# Patient Record
Sex: Female | Born: 1955 | Race: White | Hispanic: No | Marital: Married | Smoking: Never smoker
Health system: Southern US, Community
[De-identification: ages and names within clinical notes are randomized; demographics above are authoritative.]

## PROBLEM LIST (undated history)

## (undated) DIAGNOSIS — M1990 Unspecified osteoarthritis, unspecified site: Secondary | ICD-10-CM

## (undated) DIAGNOSIS — E785 Hyperlipidemia, unspecified: Secondary | ICD-10-CM

## (undated) DIAGNOSIS — R7303 Prediabetes: Secondary | ICD-10-CM

## (undated) HISTORY — DX: Unspecified osteoarthritis, unspecified site: M19.90

## (undated) HISTORY — PX: EYE SURGERY: SHX253

## (undated) HISTORY — DX: Hyperlipidemia, unspecified: E78.5

## (undated) HISTORY — DX: Prediabetes: R73.03

---

## 2005-11-21 LAB — HM COLONOSCOPY

## 2011-07-22 ENCOUNTER — Encounter: Payer: Self-pay | Admitting: Family Medicine

## 2011-07-22 ENCOUNTER — Ambulatory Visit (INDEPENDENT_AMBULATORY_CARE_PROVIDER_SITE_OTHER): Payer: BC Managed Care – PPO | Admitting: Family Medicine

## 2011-07-22 VITALS — BP 114/72 | HR 80 | Temp 98.8°F | Ht 63.5 in | Wt 148.0 lb

## 2011-07-22 DIAGNOSIS — F439 Reaction to severe stress, unspecified: Secondary | ICD-10-CM

## 2011-07-22 DIAGNOSIS — F430 Acute stress reaction: Secondary | ICD-10-CM

## 2011-07-22 NOTE — Progress Notes (Signed)
  Subjective:    Patient ID: Olivia [name], female    DOB: 09/27/1955, 56 y.o.   MRN: [id number]  HPI Pt here to establish.     Review of Systems As above    Objective:   Physical Exam  Constitutional: She is oriented to person, place, and time. She appears well-developed and well-nourished.  Cardiovascular: Normal rate and regular rhythm.   No murmur heard. Pulmonary/Chest: Effort normal and breath sounds normal. No respiratory distress. She has no wheezes. She has no rales.  Neurological: She is alert and oriented to person, place, and time.  Psychiatric: She has a normal mood and affect. Her behavior is normal. Judgment and thought content normal.          Assessment & Plan:  Stress reaction---  Improving every day                                No further treatment necessary rto for cpe

## 2011-07-22 NOTE — Patient Instructions (Addendum)
Preventive Care for Adults, Female A healthy lifestyle and preventive care can promote health and wellness. Preventive health guidelines for women include the following key practices.  A routine yearly physical is a good way to check with your caregiver about your health and preventive screening. It is a chance to share any concerns and updates on your health, and to receive a thorough exam.   Visit your dentist for a routine exam and preventive care every 6 months. Brush your teeth twice a day and floss once a day. Good oral hygiene prevents tooth decay and gum disease.   The frequency of eye exams is based on your age, health, family medical history, use of contact lenses, and other factors. Follow your caregiver's recommendations for frequency of eye exams.   Eat a healthy diet. Foods like vegetables, fruits, whole grains, low-fat dairy products, and lean protein foods contain the nutrients you need without too many calories. Decrease your intake of foods high in solid fats, added sugars, and salt. Eat the right amount of calories for you.Get information about a proper diet from your caregiver, if necessary.   Regular physical exercise is one of the most important things you can do for your health. Most adults should get at least 150 minutes of moderate-intensity exercise (any activity that increases your heart rate and causes you to sweat) each week. In addition, most adults need muscle-strengthening exercises on 2 or more days a week.   Maintain a healthy weight. The body mass index (BMI) is a screening tool to identify possible weight problems. It provides an estimate of body fat based on height and weight. Your caregiver can help determine your BMI, and can help you achieve or maintain a healthy weight.For adults 20 years and older:   A BMI below 18.5 is considered underweight.   A BMI of 18.5 to 24.9 is normal.   A BMI of 25 to 29.9 is considered overweight.   A BMI of 30 and above is  considered obese.   Maintain normal blood lipids and cholesterol levels by exercising and minimizing your intake of saturated fat. Eat a balanced diet with plenty of fruit and vegetables. Blood tests for lipids and cholesterol should begin at age 20 and be repeated every 5 years. If your lipid or cholesterol levels are high, you are over 50, or you are at high risk for heart disease, you may need your cholesterol levels checked more frequently.Ongoing high lipid and cholesterol levels should be treated with medicines if diet and exercise are not effective.   If you smoke, find out from your caregiver how to quit. If you do not use tobacco, do not start.   If you are pregnant, do not drink alcohol. If you are breastfeeding, be very cautious about drinking alcohol. If you are not pregnant and choose to drink alcohol, do not exceed 1 drink per day. One drink is considered to be 12 ounces (355 mL) of beer, 5 ounces (148 mL) of wine, or 1.5 ounces (44 mL) of liquor.   Avoid use of street drugs. Do not share needles with anyone. Ask for help if you need support or instructions about stopping the use of drugs.   High blood pressure causes heart disease and increases the risk of stroke. Your blood pressure should be checked at least every 1 to 2 years. Ongoing high blood pressure should be treated with medicines if weight loss and exercise are not effective.   If you are 55 to 58   years old, ask your caregiver if you should take aspirin to prevent strokes.   Diabetes screening involves taking a blood sample to check your fasting blood sugar level. This should be done once every 3 years, after age 45, if you are within normal weight and without risk factors for diabetes. Testing should be considered at a younger age or be carried out more frequently if you are overweight and have at least 1 risk factor for diabetes.   Breast cancer screening is essential preventive care for women. You should practice "breast  self-awareness." This means understanding the normal appearance and feel of your breasts and may include breast self-examination. Any changes detected, no matter how small, should be reported to a caregiver. Women in their 20s and 30s should have a clinical breast exam (CBE) by a caregiver as part of a regular health exam every 1 to 3 years. After age 40, women should have a CBE every year. Starting at age 40, women should consider having a mammography (breast X-ray test) every year. Women who have a family history of breast cancer should talk to their caregiver about genetic screening. Women at a high risk of breast cancer should talk to their caregivers about having magnetic resonance imaging (MRI) and a mammography every year.   The Pap test is a screening test for cervical cancer. A Pap test can show cell changes on the cervix that might become cervical cancer if left untreated. A Pap test is a procedure in which cells are obtained and examined from the lower end of the uterus (cervix).   Women should have a Pap test starting at age 21.   Between ages 21 and 29, Pap tests should be repeated every 2 years.   Beginning at age 30, you should have a Pap test every 3 years as long as the past 3 Pap tests have been normal.   Some women have medical problems that increase the chance of getting cervical cancer. Talk to your caregiver about these problems. It is especially important to talk to your caregiver if a new problem develops soon after your last Pap test. In these cases, your caregiver may recommend more frequent screening and Pap tests.   The above recommendations are the same for women who have or have not gotten the vaccine for human papillomavirus (HPV).   If you had a hysterectomy for a problem that was not cancer or a condition that could lead to cancer, then you no longer need Pap tests. Even if you no longer need a Pap test, a regular exam is a good idea to make sure no other problems are  starting.   If you are between ages 65 and 70, and you have had normal Pap tests going back 10 years, you no longer need Pap tests. Even if you no longer need a Pap test, a regular exam is a good idea to make sure no other problems are starting.   If you have had past treatment for cervical cancer or a condition that could lead to cancer, you need Pap tests and screening for cancer for at least 20 years after your treatment.   If Pap tests have been discontinued, risk factors (such as a new sexual partner) need to be reassessed to determine if screening should be resumed.   The HPV test is an additional test that may be used for cervical cancer screening. The HPV test looks for the virus that can cause the cell changes on the cervix.   The cells collected during the Pap test can be tested for HPV. The HPV test could be used to screen women aged 30 years and older, and should be used in women of any age who have unclear Pap test results. After the age of 30, women should have HPV testing at the same frequency as a Pap test.   Colorectal cancer can be detected and often prevented. Most routine colorectal cancer screening begins at the age of 50 and continues through age 75. However, your caregiver may recommend screening at an earlier age if you have risk factors for colon cancer. On a yearly basis, your caregiver may provide home test kits to check for hidden blood in the stool. Use of a small camera at the end of a tube, to directly examine the colon (sigmoidoscopy or colonoscopy), can detect the earliest forms of colorectal cancer. Talk to your caregiver about this at age 50, when routine screening begins. Direct examination of the colon should be repeated every 5 to 10 years through age 75, unless early forms of pre-cancerous polyps or small growths are found.   Hepatitis C blood testing is recommended for all people born from 1945 through 1965 and any individual with known risks for hepatitis C.    Practice safe sex. Use condoms and avoid high-risk sexual practices to reduce the spread of sexually transmitted infections (STIs). STIs include gonorrhea, chlamydia, syphilis, trichomonas, herpes, HPV, and human immunodeficiency virus (HIV). Herpes, HIV, and HPV are viral illnesses that have no cure. They can result in disability, cancer, and death. Sexually active women aged 25 and younger should be checked for chlamydia. Older women with new or multiple partners should also be tested for chlamydia. Testing for other STIs is recommended if you are sexually active and at increased risk.   Osteoporosis is a disease in which the bones lose minerals and strength with aging. This can result in serious bone fractures. The risk of osteoporosis can be identified using a bone density scan. Women ages 65 and over and women at risk for fractures or osteoporosis should discuss screening with their caregivers. Ask your caregiver whether you should take a calcium supplement or vitamin D to reduce the rate of osteoporosis.   Menopause can be associated with physical symptoms and risks. Hormone replacement therapy is available to decrease symptoms and risks. You should talk to your caregiver about whether hormone replacement therapy is right for you.   Use sunscreen with sun protection factor (SPF) of 30 or more. Apply sunscreen liberally and repeatedly throughout the day. You should seek shade when your shadow is shorter than you. Protect yourself by wearing long sleeves, pants, a wide-brimmed hat, and sunglasses year round, whenever you are outdoors.   Once a month, do a whole body skin exam, using a mirror to look at the skin on your back. Notify your caregiver of new moles, moles that have irregular borders, moles that are larger than a pencil eraser, or moles that have changed in shape or color.   Stay current with required immunizations.   Influenza. You need a dose every fall (or winter). The composition of  the flu vaccine changes each year, so being vaccinated once is not enough.   Pneumococcal polysaccharide. You need 1 to 2 doses if you smoke cigarettes or if you have certain chronic medical conditions. You need 1 dose at age 65 (or older) if you have never been vaccinated.   Tetanus, diphtheria, pertussis (Tdap, Td). Get 1 dose of   Tdap vaccine if you are younger than age 65, are over 65 and have contact with an infant, are a healthcare worker, are pregnant, or simply want to be protected from whooping cough. After that, you need a Td booster dose every 10 years. Consult your caregiver if you have not had at least 3 tetanus and diphtheria-containing shots sometime in your life or have a deep or dirty wound.   HPV. You need this vaccine if you are a woman age 26 or younger. The vaccine is given in 3 doses over 6 months.   Measles, mumps, rubella (MMR). You need at least 1 dose of MMR if you were born in 1957 or later. You may also need a second dose.   Meningococcal. If you are age 19 to 21 and a first-year college student living in a residence hall, or have one of several medical conditions, you need to get vaccinated against meningococcal disease. You may also need additional booster doses.   Zoster (shingles). If you are age 60 or older, you should get this vaccine.   Varicella (chickenpox). If you have never had chickenpox or you were vaccinated but received only 1 dose, talk to your caregiver to find out if you need this vaccine.   Hepatitis A. You need this vaccine if you have a specific risk factor for hepatitis A virus infection or you simply wish to be protected from this disease. The vaccine is usually given as 2 doses, 6 to 18 months apart.   Hepatitis B. You need this vaccine if you have a specific risk factor for hepatitis B virus infection or you simply wish to be protected from this disease. The vaccine is given in 3 doses, usually over 6 months.  Preventive Services /  Frequency Ages 19 to 39  Blood pressure check.** / Every 1 to 2 years.   Lipid and cholesterol check.** / Every 5 years beginning at age 20.   Clinical breast exam.** / Every 3 years for women in their 20s and 30s.   Pap test.** / Every 2 years from ages 21 through 29. Every 3 years starting at age 30 through age 65 or 70 with a history of 3 consecutive normal Pap tests.   HPV screening.** / Every 3 years from ages 30 through ages 65 to 70 with a history of 3 consecutive normal Pap tests.   Hepatitis C blood test.** / For any individual with known risks for hepatitis C.   Skin self-exam. / Monthly.   Influenza immunization.** / Every year.   Pneumococcal polysaccharide immunization.** / 1 to 2 doses if you smoke cigarettes or if you have certain chronic medical conditions.   Tetanus, diphtheria, pertussis (Tdap, Td) immunization. / A one-time dose of Tdap vaccine. After that, you need a Td booster dose every 10 years.   HPV immunization. / 3 doses over 6 months, if you are 26 and younger.   Measles, mumps, rubella (MMR) immunization. / You need at least 1 dose of MMR if you were born in 1957 or later. You may also need a second dose.   Meningococcal immunization. / 1 dose if you are age 19 to 21 and a first-year college student living in a residence hall, or have one of several medical conditions, you need to get vaccinated against meningococcal disease. You may also need additional booster doses.   Varicella immunization.** / Consult your caregiver.   Hepatitis A immunization.** / Consult your caregiver. 2 doses, 6 to 18 months   apart.   Hepatitis B immunization.** / Consult your caregiver. 3 doses usually over 6 months.  Ages 40 to 64  Blood pressure check.** / Every 1 to 2 years.   Lipid and cholesterol check.** / Every 5 years beginning at age 20.   Clinical breast exam.** / Every year after age 40.   Mammogram.** / Every year beginning at age 40 and continuing for as  long as you are in good health. Consult with your caregiver.   Pap test.** / Every 3 years starting at age 30 through age 65 or 70 with a history of 3 consecutive normal Pap tests.   HPV screening.** / Every 3 years from ages 30 through ages 65 to 70 with a history of 3 consecutive normal Pap tests.   Fecal occult blood test (FOBT) of stool. / Every year beginning at age 50 and continuing until age 75. You may not need to do this test if you get a colonoscopy every 10 years.   Flexible sigmoidoscopy or colonoscopy.** / Every 5 years for a flexible sigmoidoscopy or every 10 years for a colonoscopy beginning at age 50 and continuing until age 75.   Hepatitis C blood test.** / For all people born from 1945 through 1965 and any individual with known risks for hepatitis C.   Skin self-exam. / Monthly.   Influenza immunization.** / Every year.   Pneumococcal polysaccharide immunization.** / 1 to 2 doses if you smoke cigarettes or if you have certain chronic medical conditions.   Tetanus, diphtheria, pertussis (Tdap, Td) immunization.** / A one-time dose of Tdap vaccine. After that, you need a Td booster dose every 10 years.   Measles, mumps, rubella (MMR) immunization. / You need at least 1 dose of MMR if you were born in 1957 or later. You may also need a second dose.   Varicella immunization.** / Consult your caregiver.   Meningococcal immunization.** / Consult your caregiver.   Hepatitis A immunization.** / Consult your caregiver. 2 doses, 6 to 18 months apart.   Hepatitis B immunization.** / Consult your caregiver. 3 doses, usually over 6 months.  Ages 65 and over  Blood pressure check.** / Every 1 to 2 years.   Lipid and cholesterol check.** / Every 5 years beginning at age 20.   Clinical breast exam.** / Every year after age 40.   Mammogram.** / Every year beginning at age 40 and continuing for as long as you are in good health. Consult with your caregiver.   Pap test.** /  Every 3 years starting at age 30 through age 65 or 70 with a 3 consecutive normal Pap tests. Testing can be stopped between 65 and 70 with 3 consecutive normal Pap tests and no abnormal Pap or HPV tests in the past 10 years.   HPV screening.** / Every 3 years from ages 30 through ages 65 or 70 with a history of 3 consecutive normal Pap tests. Testing can be stopped between 65 and 70 with 3 consecutive normal Pap tests and no abnormal Pap or HPV tests in the past 10 years.   Fecal occult blood test (FOBT) of stool. / Every year beginning at age 50 and continuing until age 75. You may not need to do this test if you get a colonoscopy every 10 years.   Flexible sigmoidoscopy or colonoscopy.** / Every 5 years for a flexible sigmoidoscopy or every 10 years for a colonoscopy beginning at age 50 and continuing until age 75.   Hepatitis   C blood test.** / For all people born from 1945 through 1965 and any individual with known risks for hepatitis C.   Osteoporosis screening.** / A one-time screening for women ages 65 and over and women at risk for fractures or osteoporosis.   Skin self-exam. / Monthly.   Influenza immunization.** / Every year.   Pneumococcal polysaccharide immunization.** / 1 dose at age 65 (or older) if you have never been vaccinated.   Tetanus, diphtheria, pertussis (Tdap, Td) immunization. / A one-time dose of Tdap vaccine if you are over 65 and have contact with an infant, are a healthcare worker, or simply want to be protected from whooping cough. After that, you need a Td booster dose every 10 years.   Varicella immunization.** / Consult your caregiver.   Meningococcal immunization.** / Consult your caregiver.   Hepatitis A immunization.** / Consult your caregiver. 2 doses, 6 to 18 months apart.   Hepatitis B immunization.** / Check with your caregiver. 3 doses, usually over 6 months.  ** Family history and personal history of risk and conditions may change your caregiver's  recommendations. Document Released: 03/22/2001 Document Revised: 01/13/2011 Document Reviewed: 06/21/2010 [employer] Patient Information 2012 [employer]. 

## 2012-11-20 ENCOUNTER — Telehealth: Payer: Self-pay

## 2012-11-20 NOTE — Telephone Encounter (Signed)
Medication and allergies: done   [facility] [address] [place] for local pharmacy   Immunizations due: UTD flu vaccine at work  A/P:  Last:  Unsure of dates for [facility]. Has fallen behind and needs to get caught up  To Discuss with Provider: Would like PAP today Joint pain ? HM issues

## 2012-11-21 ENCOUNTER — Ambulatory Visit (INDEPENDENT_AMBULATORY_CARE_PROVIDER_SITE_OTHER): Payer: 59 | Admitting: Family Medicine

## 2012-11-21 ENCOUNTER — Encounter: Payer: Self-pay | Admitting: Family Medicine

## 2012-11-21 ENCOUNTER — Other Ambulatory Visit (HOSPITAL_COMMUNITY)
Admission: RE | Admit: 2012-11-21 | Discharge: 2012-11-21 | Disposition: A | Discharge status: Home / Self Care | Payer: 59 | Source: Ambulatory Visit | Attending: Family Medicine | Admitting: Family Medicine

## 2012-11-21 VITALS — BP 116/70 | HR 81 | Temp 98.3°F | Ht 63.5 in | Wt 150.0 lb

## 2012-11-21 DIAGNOSIS — Z01419 Encounter for gynecological examination (general) (routine) without abnormal findings: Secondary | ICD-10-CM | POA: Insufficient documentation

## 2012-11-21 DIAGNOSIS — Z124 Encounter for screening for malignant neoplasm of cervix: Secondary | ICD-10-CM

## 2012-11-21 DIAGNOSIS — Z1231 Encounter for screening mammogram for malignant neoplasm of breast: Secondary | ICD-10-CM

## 2012-11-21 DIAGNOSIS — Z0000 Encounter for general adult medical examination without abnormal findings: Secondary | ICD-10-CM

## 2012-11-21 DIAGNOSIS — E2839 Other primary ovarian failure: Secondary | ICD-10-CM

## 2012-11-21 DIAGNOSIS — N390 Urinary tract infection, site not specified: Secondary | ICD-10-CM

## 2012-11-21 DIAGNOSIS — Z1151 Encounter for screening for human papillomavirus (HPV): Secondary | ICD-10-CM | POA: Insufficient documentation

## 2012-11-21 LAB — POCT URINALYSIS DIPSTICK
Bilirubin, UA: NEGATIVE
Glucose, UA: NEGATIVE
Ketones, UA: NEGATIVE
Spec Grav, UA: <=1.005

## 2012-11-21 LAB — CBC WITH DIFFERENTIAL/PLATELET
Basophils Relative: 0.7 % (ref 0.0–3.0)
Eosinophils Absolute: 0.2 10*3/uL (ref 0.0–0.7)
Lymphs Abs: 2.4 10*3/uL (ref 0.7–4.0)
MCHC: 34.4 g/dL (ref 30.0–36.0)
MCV: 87.5 fl (ref 78.0–100.0)
Monocytes Absolute: 0.5 10*3/uL (ref 0.1–1.0)
Neutrophils Relative %: 54.9 % (ref 43.0–77.0)
RBC: 4.81 Mil/uL (ref 3.87–5.11)

## 2012-11-21 LAB — TSH: TSH: 1.62 u[IU]/mL (ref 0.35–5.50)

## 2012-11-21 LAB — LIPID PANEL
Cholesterol: 258 mg/dL — ABNORMAL HIGH (ref 0–200)
HDL: 69.1 mg/dL (ref >39.00)
Triglycerides: 116 mg/dL (ref 0.0–149.0)

## 2012-11-21 LAB — HEPATIC FUNCTION PANEL
Albumin: 4.6 g/dL (ref 3.5–5.2)
Bilirubin, Direct: 0 mg/dL (ref 0.0–0.3)
Total Protein: 8.1 g/dL (ref 6.0–8.3)

## 2012-11-21 LAB — BASIC METABOLIC PANEL
BUN: 11 mg/dL (ref 6–23)
CO2: 29 mEq/L (ref 19–32)
Chloride: 99 mEq/L (ref 96–112)
Creatinine, Ser: 0.7 mg/dL (ref 0.4–1.2)

## 2012-11-21 LAB — MICROALBUMIN / CREATININE URINE RATIO: Microalb, Ur: 0.1 mg/dL (ref 0.0–1.9)

## 2012-11-21 NOTE — Progress Notes (Signed)
Subjective:     Olivia [name] is a 57 y.o. female and is here for a comprehensive physical exam. The patient reports no problems.  History   Social History  . Marital Status: Married    Spouse Name: N/A    Number of Children: 2  . Years of Education: N/A   Occupational History  . 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 Smoking status: Never Smoker   . Smokeless tobacco: Never Used  . Alcohol Use: 1.0 oz/week    2 drink(s) per week  . Drug Use: No  . Sexual Activity: Yes    Partners: Male   Other Topics Concern  . Not on file   Social History Narrative   Exercise---  3-4 x a week   Health Maintenance  Topic Date Due  . Mammogram  01/26/1972  . Pap Smear  01/26/1972  . Influenza Vaccine  09/07/2013  . Colonoscopy  02/08/2015  . Tetanus/tdap  09/07/2020    The following portions of the patient's history were reviewed and updated as appropriate:  She  has no past medical history on file. She  does not have a problem list on file. She  has no past surgical history on file. Her family history includes Arthritis in her mother; Cancer in her father; Dementia in her mother; Diabetes in her father; Heart disease in her father; Hypertension in her father; Stroke in her father. She  reports that she has never smoked. She has never used smokeless tobacco. She reports that she drinks about 1.0 ounces of alcohol per week. She reports that she does not use illicit drugs. She has a current medication list which includes the following prescription(s): aspirin-acetaminophen-caffeine. Current Outpatient Prescriptions on File Prior to Visit  Medication Sig Dispense Refill  . aspirin-acetaminophen-caffeine (EXCEDRIN MIGRAINE) 250-250-65 MG per tablet Take 1 tablet by mouth every 6 (six) hours as needed.       No current facility-administered medications on file prior to visit.   She has No Known Allergies..  Review of Systems Review of Systems  Constitutional:  Negative for activity change, appetite change and fatigue.  HENT: Negative for hearing loss, congestion, tinnitus and ear discharge.  dentist--due Eyes: Negative for visual disturbance (see optho q1y -- vision corrected to 20/20 with glasses).  Respiratory: Negative for cough, chest tightness and shortness of breath.   Cardiovascular: Negative for chest pain, palpitations and leg swelling.  Gastrointestinal: Negative for abdominal pain, diarrhea, constipation and abdominal distention.  Genitourinary: Negative for urgency, frequency, decreased urine volume and difficulty urinating.  Musculoskeletal: Negative for back pain, arthralgias and gait problem.  Skin: Negative for color change, pallor and rash.  Neurological: Negative for dizziness, light-headedness, numbness and headaches.  Hematological: Negative for adenopathy. Does not bruise/bleed easily.  Psychiatric/Behavioral: Negative for suicidal ideas, confusion, sleep disturbance, self-injury, dysphoric mood, decreased concentration and agitation.       Objective:    BP 116/70  Pulse 81  Temp(Src) 98.3 F (36.8 C) (Oral)  Ht 5' 3.5" (1.613 m)  Wt 150 lb (68.04 kg)  BMI 26.15 kg/m2  SpO2 98% General appearance: alert, cooperative, appears stated age and no distress Head: Normocephalic, without obvious abnormality, atraumatic Eyes: conjunctivae/corneas clear. PERRL, EOM's intact. Fundi benign. Ears: normal TM's and external ear canals both ears Nose: Nares normal. Septum midline. Mucosa normal. No drainage or sinus tenderness. Throat: lips, mucosa, and tongue normal; teeth and gums normal Neck: no adenopathy, no carotid bruit, no JVD, supple, symmetrical, trachea  midline and thyroid not enlarged, symmetric, no tenderness/mass/nodules Back: symmetric, no curvature. ROM normal. No CVA tenderness. Lungs: clear to auscultation bilaterally Breasts: normal appearance, no masses or tenderness Heart: regular rate and rhythm, S1, S2  normal, no murmur, click, rub or gallop Abdomen: soft, non-tender; bowel sounds normal; no masses,  no organomegaly Pelvic: cervix normal in appearance, external genitalia normal, no adnexal masses or tenderness, no cervical motion tenderness, rectovaginal septum normal, uterus normal size, shape, and consistency and vagina normal without discharge --pap done Extremities: extremities normal, atraumatic, no cyanosis or edema Pulses: 2+ and symmetric Skin: Skin color, texture, turgor normal. No rashes or lesions Lymph nodes: Cervical, supraclavicular, and axillary nodes normal. Neurologic: Alert and oriented X 3, normal strength and tone. Normal symmetric reflexes. Normal coordination and gait Psych-no anxiety , no depression      Assessment:    Healthy female exam.     Plan:     ghm utd Check labs See After Visit Summary for Counseling Recommendations

## 2012-11-21 NOTE — Patient Instructions (Signed)
Preventive Care for Adults, Female A healthy lifestyle and preventive care can promote health and wellness. Preventive health guidelines for women include the following key practices.  A routine yearly physical is a good way to check with your caregiver about your health and preventive screening. It is a chance to share any concerns and updates on your health, and to receive a thorough exam.  Visit your dentist for a routine exam and preventive care every 6 months. Brush your teeth twice a day and floss once a day. Good oral hygiene prevents tooth decay and gum disease.  The frequency of eye exams is based on your age, health, family medical history, use of contact lenses, and other factors. Follow your caregiver's recommendations for frequency of eye exams.  Eat a healthy diet. Foods like vegetables, fruits, whole grains, low-fat dairy products, and lean protein foods contain the nutrients you need without too many calories. Decrease your intake of foods high in solid fats, added sugars, and salt. Eat the right amount of calories for you.Get information about a proper diet from your caregiver, if necessary.  Regular physical exercise is one of the most important things you can do for your health. Most adults should get at least 150 minutes of moderate-intensity exercise (any activity that increases your heart rate and causes you to sweat) each week. In addition, most adults need muscle-strengthening exercises on 2 or more days a week.  Maintain a healthy weight. The body mass index (BMI) is a screening tool to identify possible weight problems. It provides an estimate of body fat based on height and weight. Your caregiver can help determine your BMI, and can help you achieve or maintain a healthy weight.For adults 20 years and older:  A BMI below 18.5 is considered underweight.  A BMI of 18.5 to 24.9 is normal.  A BMI of 25 to 29.9 is considered overweight.  A BMI of 30 and above is  considered obese.  Maintain normal blood lipids and cholesterol levels by exercising and minimizing your intake of saturated fat. Eat a balanced diet with plenty of fruit and vegetables. Blood tests for lipids and cholesterol should begin at age 20 and be repeated every 5 years. If your lipid or cholesterol levels are high, you are over 50, or you are at high risk for heart disease, you may need your cholesterol levels checked more frequently.Ongoing high lipid and cholesterol levels should be treated with medicines if diet and exercise are not effective.  If you smoke, find out from your caregiver how to quit. If you do not use tobacco, do not start.  If you are pregnant, do not drink alcohol. If you are breastfeeding, be very cautious about drinking alcohol. If you are not pregnant and choose to drink alcohol, do not exceed 1 drink per day. One drink is considered to be 12 ounces (355 mL) of beer, 5 ounces (148 mL) of wine, or 1.5 ounces (44 mL) of liquor.  Avoid use of street drugs. Do not share needles with anyone. Ask for help if you need support or instructions about stopping the use of drugs.  High blood pressure causes heart disease and increases the risk of stroke. Your blood pressure should be checked at least every 1 to 2 years. Ongoing high blood pressure should be treated with medicines if weight loss and exercise are not effective.  If you are 55 to 59 years old, ask your caregiver if you should take aspirin to prevent strokes.  Diabetes   screening involves taking a blood sample to check your fasting blood sugar level. This should be done once every 3 years, after age 45, if you are within normal weight and without risk factors for diabetes. Testing should be considered at a younger age or be carried out more frequently if you are overweight and have at least 1 risk factor for diabetes.  Breast cancer screening is essential preventive care for women. You should practice "breast  self-awareness." This means understanding the normal appearance and feel of your breasts and may include breast self-examination. Any changes detected, no matter how small, should be reported to a caregiver. Women in their 20s and 30s should have a clinical breast exam (CBE) by a caregiver as part of a regular health exam every 1 to 3 years. After age 40, women should have a CBE every year. Starting at age 40, women should consider having a mammography (breast X-ray test) every year. Women who have a family history of breast cancer should talk to their caregiver about genetic screening. Women at a high risk of breast cancer should talk to their caregivers about having magnetic resonance imaging (MRI) and a mammography every year.  The Pap test is a screening test for cervical cancer. A Pap test can show cell changes on the cervix that might become cervical cancer if left untreated. A Pap test is a procedure in which cells are obtained and examined from the lower end of the uterus (cervix).  Women should have a Pap test starting at age 21.  Between ages 21 and 29, Pap tests should be repeated every 2 years.  Beginning at age 30, you should have a Pap test every 3 years as long as the past 3 Pap tests have been normal.  Some women have medical problems that increase the chance of getting cervical cancer. Talk to your caregiver about these problems. It is especially important to talk to your caregiver if a new problem develops soon after your last Pap test. In these cases, your caregiver may recommend more frequent screening and Pap tests.  The above recommendations are the same for women who have or have not gotten the vaccine for human papillomavirus (HPV).  If you had a hysterectomy for a problem that was not cancer or a condition that could lead to cancer, then you no longer need Pap tests. Even if you no longer need a Pap test, a regular exam is a good idea to make sure no other problems are  starting.  If you are between ages 65 and 70, and you have had normal Pap tests going back 10 years, you no longer need Pap tests. Even if you no longer need a Pap test, a regular exam is a good idea to make sure no other problems are starting.  If you have had past treatment for cervical cancer or a condition that could lead to cancer, you need Pap tests and screening for cancer for at least 20 years after your treatment.  If Pap tests have been discontinued, risk factors (such as a new sexual partner) need to be reassessed to determine if screening should be resumed.  The HPV test is an additional test that may be used for cervical cancer screening. The HPV test looks for the virus that can cause the cell changes on the cervix. The cells collected during the Pap test can be tested for HPV. The HPV test could be used to screen women aged 30 years and older, and should   be used in women of any age who have unclear Pap test results. After the age of 30, women should have HPV testing at the same frequency as a Pap test.  Colorectal cancer can be detected and often prevented. Most routine colorectal cancer screening begins at the age of 50 and continues through age 75. However, your caregiver may recommend screening at an earlier age if you have risk factors for colon cancer. On a yearly basis, your caregiver may provide home test kits to check for hidden blood in the stool. Use of a small camera at the end of a tube, to directly examine the colon (sigmoidoscopy or colonoscopy), can detect the earliest forms of colorectal cancer. Talk to your caregiver about this at age 50, when routine screening begins. Direct examination of the colon should be repeated every 5 to 10 years through age 75, unless early forms of pre-cancerous polyps or small growths are found.  Hepatitis C blood testing is recommended for all people born from 1945 through 1965 and any individual with known risks for hepatitis C.  Practice  safe sex. Use condoms and avoid high-risk sexual practices to reduce the spread of sexually transmitted infections (STIs). STIs include gonorrhea, chlamydia, syphilis, trichomonas, herpes, HPV, and human immunodeficiency virus (HIV). Herpes, HIV, and HPV are viral illnesses that have no cure. They can result in disability, cancer, and death. Sexually active women aged 25 and younger should be checked for chlamydia. Older women with new or multiple partners should also be tested for chlamydia. Testing for other STIs is recommended if you are sexually active and at increased risk.  Osteoporosis is a disease in which the bones lose minerals and strength with aging. This can result in serious bone fractures. The risk of osteoporosis can be identified using a bone density scan. Women ages 65 and over and women at risk for fractures or osteoporosis should discuss screening with their caregivers. Ask your caregiver whether you should take a calcium supplement or vitamin D to reduce the rate of osteoporosis.  Menopause can be associated with physical symptoms and risks. Hormone replacement therapy is available to decrease symptoms and risks. You should talk to your caregiver about whether hormone replacement therapy is right for you.  Use sunscreen with sun protection factor (SPF) of 30 or more. Apply sunscreen liberally and repeatedly throughout the day. You should seek shade when your shadow is shorter than you. Protect yourself by wearing long sleeves, pants, a wide-brimmed hat, and sunglasses year round, whenever you are outdoors.  Once a month, do a whole body skin exam, using a mirror to look at the skin on your back. Notify your caregiver of new moles, moles that have irregular borders, moles that are larger than a pencil eraser, or moles that have changed in shape or color.  Stay current with required immunizations.  Influenza. You need a dose every fall (or winter). The composition of the flu vaccine  changes each year, so being vaccinated once is not enough.  Pneumococcal polysaccharide. You need 1 to 2 doses if you smoke cigarettes or if you have certain chronic medical conditions. You need 1 dose at age 65 (or older) if you have never been vaccinated.  Tetanus, diphtheria, pertussis (Tdap, Td). Get 1 dose of Tdap vaccine if you are younger than age 65, are over 65 and have contact with an infant, are a healthcare worker, are pregnant, or simply want to be protected from whooping cough. After that, you need a Td   booster dose every 10 years. Consult your caregiver if you have not had at least 3 tetanus and diphtheria-containing shots sometime in your life or have a deep or dirty wound.  HPV. You need this vaccine if you are a woman age 26 or younger. The vaccine is given in 3 doses over 6 months.  Measles, mumps, rubella (MMR). You need at least 1 dose of MMR if you were born in 1957 or later. You may also need a second dose.  Meningococcal. If you are age 19 to 21 and a first-year college student living in a residence hall, or have one of several medical conditions, you need to get vaccinated against meningococcal disease. You may also need additional booster doses.  Zoster (shingles). If you are age 60 or older, you should get this vaccine.  Varicella (chickenpox). If you have never had chickenpox or you were vaccinated but received only 1 dose, talk to your caregiver to find out if you need this vaccine.  Hepatitis A. You need this vaccine if you have a specific risk factor for hepatitis A virus infection or you simply wish to be protected from this disease. The vaccine is usually given as 2 doses, 6 to 18 months apart.  Hepatitis B. You need this vaccine if you have a specific risk factor for hepatitis B virus infection or you simply wish to be protected from this disease. The vaccine is given in 3 doses, usually over 6 months. Preventive Services / Frequency Ages 19 to 39  Blood  pressure check.** / Every 1 to 2 years.  Lipid and cholesterol check.** / Every 5 years beginning at age 20.  Clinical breast exam.** / Every 3 years for women in their 20s and 30s.  Pap test.** / Every 2 years from ages 21 through 29. Every 3 years starting at age 30 through age 65 or 70 with a history of 3 consecutive normal Pap tests.  HPV screening.** / Every 3 years from ages 30 through ages 65 to 70 with a history of 3 consecutive normal Pap tests.  Hepatitis C blood test.** / For any individual with known risks for hepatitis C.  Skin self-exam. / Monthly.  Influenza immunization.** / Every year.  Pneumococcal polysaccharide immunization.** / 1 to 2 doses if you smoke cigarettes or if you have certain chronic medical conditions.  Tetanus, diphtheria, pertussis (Tdap, Td) immunization. / A one-time dose of Tdap vaccine. After that, you need a Td booster dose every 10 years.  HPV immunization. / 3 doses over 6 months, if you are 26 and younger.  Measles, mumps, rubella (MMR) immunization. / You need at least 1 dose of MMR if you were born in 1957 or later. You may also need a second dose.  Meningococcal immunization. / 1 dose if you are age 19 to 21 and a first-year college student living in a residence hall, or have one of several medical conditions, you need to get vaccinated against meningococcal disease. You may also need additional booster doses.  Varicella immunization.** / Consult your caregiver.  Hepatitis A immunization.** / Consult your caregiver. 2 doses, 6 to 18 months apart.  Hepatitis B immunization.** / Consult your caregiver. 3 doses usually over 6 months. Ages 40 to 64  Blood pressure check.** / Every 1 to 2 years.  Lipid and cholesterol check.** / Every 5 years beginning at age 20.  Clinical breast exam.** / Every year after age 40.  Mammogram.** / Every year beginning at age 40   and continuing for as long as you are in good health. Consult with your  caregiver.  Pap test.** / Every 3 years starting at age 30 through age 65 or 70 with a history of 3 consecutive normal Pap tests.  HPV screening.** / Every 3 years from ages 30 through ages 65 to 70 with a history of 3 consecutive normal Pap tests.  Fecal occult blood test (FOBT) of stool. / Every year beginning at age 50 and continuing until age 75. You may not need to do this test if you get a colonoscopy every 10 years.  Flexible sigmoidoscopy or colonoscopy.** / Every 5 years for a flexible sigmoidoscopy or every 10 years for a colonoscopy beginning at age 50 and continuing until age 75.  Hepatitis C blood test.** / For all people born from 1945 through 1965 and any individual with known risks for hepatitis C.  Skin self-exam. / Monthly.  Influenza immunization.** / Every year.  Pneumococcal polysaccharide immunization.** / 1 to 2 doses if you smoke cigarettes or if you have certain chronic medical conditions.  Tetanus, diphtheria, pertussis (Tdap, Td) immunization.** / A one-time dose of Tdap vaccine. After that, you need a Td booster dose every 10 years.  Measles, mumps, rubella (MMR) immunization. / You need at least 1 dose of MMR if you were born in 1957 or later. You may also need a second dose.  Varicella immunization.** / Consult your caregiver.  Meningococcal immunization.** / Consult your caregiver.  Hepatitis A immunization.** / Consult your caregiver. 2 doses, 6 to 18 months apart.  Hepatitis B immunization.** / Consult your caregiver. 3 doses, usually over 6 months. Ages 65 and over  Blood pressure check.** / Every 1 to 2 years.  Lipid and cholesterol check.** / Every 5 years beginning at age 20.  Clinical breast exam.** / Every year after age 40.  Mammogram.** / Every year beginning at age 40 and continuing for as long as you are in good health. Consult with your caregiver.  Pap test.** / Every 3 years starting at age 30 through age 65 or 70 with a 3  consecutive normal Pap tests. Testing can be stopped between 65 and 70 with 3 consecutive normal Pap tests and no abnormal Pap or HPV tests in the past 10 years.  HPV screening.** / Every 3 years from ages 30 through ages 65 or 70 with a history of 3 consecutive normal Pap tests. Testing can be stopped between 65 and 70 with 3 consecutive normal Pap tests and no abnormal Pap or HPV tests in the past 10 years.  Fecal occult blood test (FOBT) of stool. / Every year beginning at age 50 and continuing until age 75. You may not need to do this test if you get a colonoscopy every 10 years.  Flexible sigmoidoscopy or colonoscopy.** / Every 5 years for a flexible sigmoidoscopy or every 10 years for a colonoscopy beginning at age 50 and continuing until age 75.  Hepatitis C blood test.** / For all people born from 1945 through 1965 and any individual with known risks for hepatitis C.  Osteoporosis screening.** / A one-time screening for women ages 65 and over and women at risk for fractures or osteoporosis.  Skin self-exam. / Monthly.  Influenza immunization.** / Every year.  Pneumococcal polysaccharide immunization.** / 1 dose at age 65 (or older) if you have never been vaccinated.  Tetanus, diphtheria, pertussis (Tdap, Td) immunization. / A one-time dose of Tdap vaccine if you are over   65 and have contact with an infant, are a healthcare worker, or simply want to be protected from whooping cough. After that, you need a Td booster dose every 10 years.  Varicella immunization.** / Consult your caregiver.  Meningococcal immunization.** / Consult your caregiver.  Hepatitis A immunization.** / Consult your caregiver. 2 doses, 6 to 18 months apart.  Hepatitis B immunization.** / Check with your caregiver. 3 doses, usually over 6 months. ** Family history and personal history of risk and conditions may change your caregiver's recommendations. Document Released: 03/22/2001 Document Revised: 04/18/2011  Document Reviewed: 06/21/2010 [employer] Patient Information 2014 [employer].  

## 2012-11-21 NOTE — Addendum Note (Signed)
Addended by: Silvio Pate D on: 11/21/2012 03:54 PM   Modules accepted: Orders

## 2012-11-24 LAB — URINE CULTURE

## 2014-11-10 ENCOUNTER — Other Ambulatory Visit (HOSPITAL_COMMUNITY)
Admission: RE | Admit: 2014-11-10 | Discharge: 2014-11-10 | Disposition: A | Discharge status: Home / Self Care | Payer: 59 | Source: Ambulatory Visit | Attending: Family Medicine | Admitting: Family Medicine

## 2014-11-10 ENCOUNTER — Ambulatory Visit (INDEPENDENT_AMBULATORY_CARE_PROVIDER_SITE_OTHER): Payer: 59 | Admitting: Family Medicine

## 2014-11-10 ENCOUNTER — Encounter: Payer: Self-pay | Admitting: Family Medicine

## 2014-11-10 VITALS — BP 112/64 | HR 71 | Temp 98.0°F | Ht 64.0 in | Wt 150.0 lb

## 2014-11-10 DIAGNOSIS — Z23 Encounter for immunization: Secondary | ICD-10-CM

## 2014-11-10 DIAGNOSIS — Z0000 Encounter for general adult medical examination without abnormal findings: Secondary | ICD-10-CM | POA: Diagnosis not present

## 2014-11-10 DIAGNOSIS — Z124 Encounter for screening for malignant neoplasm of cervix: Secondary | ICD-10-CM

## 2014-11-10 DIAGNOSIS — Z1151 Encounter for screening for human papillomavirus (HPV): Secondary | ICD-10-CM | POA: Diagnosis not present

## 2014-11-10 DIAGNOSIS — Z833 Family history of diabetes mellitus: Secondary | ICD-10-CM

## 2014-11-10 DIAGNOSIS — Z01419 Encounter for gynecological examination (general) (routine) without abnormal findings: Secondary | ICD-10-CM | POA: Diagnosis present

## 2014-11-10 DIAGNOSIS — E2839 Other primary ovarian failure: Secondary | ICD-10-CM

## 2014-11-10 DIAGNOSIS — Z1231 Encounter for screening mammogram for malignant neoplasm of breast: Secondary | ICD-10-CM | POA: Diagnosis not present

## 2014-11-10 LAB — LIPID PANEL
CHOL/HDL RATIO: 4
Cholesterol: 238 mg/dL — ABNORMAL HIGH (ref 0–200)
HDL: 66.4 mg/dL (ref >39.00)
LDL Cholesterol: 152 mg/dL — ABNORMAL HIGH (ref 0–99)
NONHDL: 171.59
Triglycerides: 97 mg/dL (ref 0.0–149.0)
VLDL: 19.4 mg/dL (ref 0.0–40.0)

## 2014-11-10 LAB — TSH: TSH: 2.01 u[IU]/mL (ref 0.35–4.50)

## 2014-11-10 LAB — POCT URINALYSIS DIPSTICK
BILIRUBIN UA: NEGATIVE
Glucose, UA: NEGATIVE
KETONES UA: NEGATIVE
Leukocytes, UA: NEGATIVE
Nitrite, UA: NEGATIVE
PH UA: 6
Protein, UA: NEGATIVE
RBC UA: NEGATIVE
SPEC GRAV UA: 1.015
Urobilinogen, UA: 0.2

## 2014-11-10 LAB — CBC WITH DIFFERENTIAL/PLATELET
BASOS ABS: 0 10*3/uL (ref 0.0–0.1)
Basophils Relative: 0.7 % (ref 0.0–3.0)
EOS PCT: 4.7 % (ref 0.0–5.0)
Eosinophils Absolute: 0.3 10*3/uL (ref 0.0–0.7)
HCT: 42.5 % (ref 36.0–46.0)
Hemoglobin: 14.3 g/dL (ref 12.0–15.0)
LYMPHS PCT: 34.1 % (ref 12.0–46.0)
Lymphs Abs: 2.4 10*3/uL (ref 0.7–4.0)
MCHC: 33.6 g/dL (ref 30.0–36.0)
MCV: 88.2 fl (ref 78.0–100.0)
MONOS PCT: 7.2 % (ref 3.0–12.0)
Monocytes Absolute: 0.5 10*3/uL (ref 0.1–1.0)
Neutro Abs: 3.8 10*3/uL (ref 1.4–7.7)
Neutrophils Relative %: 53.3 % (ref 43.0–77.0)
Platelets: 250 10*3/uL (ref 150.0–400.0)
RBC: 4.82 Mil/uL (ref 3.87–5.11)
RDW: 13 % (ref 11.5–15.5)
WBC: 7.1 10*3/uL (ref 4.0–10.5)

## 2014-11-10 LAB — COMPREHENSIVE METABOLIC PANEL
ALK PHOS: 84 U/L (ref 39–117)
ALT: 15 U/L (ref 0–35)
AST: 18 U/L (ref 0–37)
Albumin: 4.4 g/dL (ref 3.5–5.2)
BILIRUBIN TOTAL: 0.5 mg/dL (ref 0.2–1.2)
BUN: 11 mg/dL (ref 6–23)
CALCIUM: 9.6 mg/dL (ref 8.4–10.5)
CO2: 30 mEq/L (ref 19–32)
Chloride: 100 mEq/L (ref 96–112)
Creatinine, Ser: 0.68 mg/dL (ref 0.40–1.20)
GFR: 93.56 mL/min (ref >60.00)
Glucose, Bld: 87 mg/dL (ref 70–99)
POTASSIUM: 4 meq/L (ref 3.5–5.1)
Sodium: 139 mEq/L (ref 135–145)
Total Protein: 7.6 g/dL (ref 6.0–8.3)

## 2014-11-10 LAB — HEMOGLOBIN A1C: HEMOGLOBIN A1C: 5.7 % (ref 4.6–6.5)

## 2014-11-10 NOTE — Progress Notes (Signed)
Subjective:     Olivia [name] is a 59 y.o. female and is here for a comprehensive physical exam. The patient reports no problems.  Social History   Social History  . Marital Status: Married    Spouse Name: N/A  . Number of Children: 2  . Years of Education: N/A   Occupational History  . 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 Smoking status: Never Smoker   . Smokeless tobacco: Never Used  . Alcohol Use: 1.0 oz/week    2 Standard drinks or equivalent per week  . Drug Use: No  . Sexual Activity:    Partners: Male   Other Topics Concern  . Not on file   Social History Narrative   Exercise---  3-4 x a week   Health Maintenance  Topic Date Due  . Hepatitis C Screening  05-10-1955  . HIV Screening  01/25/1969  . ZOSTAVAX  01/25/2014  . MAMMOGRAM  01/10/2015 (Originally 11/22/2011)  . INFLUENZA VACCINE  09/08/2015  . PAP SMEAR  11/22/2015  . COLONOSCOPY  11/22/2015  . TETANUS/TDAP  09/07/2020    The following portions of the patient's history were reviewed and updated as appropriate:  She  has no past medical history on file. She  does not have a problem list on file. She  has no past surgical history on file. Her family history includes Arthritis in her mother; Cancer in her father; Dementia in her mother; Diabetes in her father; Heart disease in her father; Hypertension in her father; Stroke in her father. She  reports that she has never smoked. She has never used smokeless tobacco. She reports that she drinks about 1.0 oz of alcohol per week. She reports that she does not use illicit drugs. She has a current medication list which includes the following prescription(s): aspirin-acetaminophen-caffeine. Current Outpatient Prescriptions on File Prior to Visit  Medication Sig Dispense Refill  . aspirin-acetaminophen-caffeine (EXCEDRIN MIGRAINE) 250-250-65 MG per tablet Take 1 tablet by mouth every 6 (six) hours as needed.     No current  facility-administered medications on file prior to visit.   She has No Known Allergies..  Review of Systems Review of Systems  Constitutional: Negative for activity change, appetite change and fatigue.  HENT: Negative for hearing loss, congestion, tinnitus and ear discharge.  dentist [id number]: Negative for visual disturbance (see optho q1y -- vision corrected to 20/20 with glasses).  Respiratory: Negative for cough, chest tightness and shortness of breath.   Cardiovascular: Negative for chest pain, palpitations and leg swelling.  Gastrointestinal: Negative for abdominal pain, diarrhea, constipation and abdominal distention.  Genitourinary: Negative for urgency, frequency, decreased urine volume and difficulty urinating.  Musculoskeletal: Negative for back pain, arthralgias and gait problem.  Skin: Negative for color change, pallor and rash.  Neurological: Negative for dizziness, light-headedness, numbness and headaches.  Hematological: Negative for adenopathy. Does not bruise/bleed easily.  Psychiatric/Behavioral: Negative for suicidal ideas, confusion, sleep disturbance, self-injury, dysphoric mood, decreased concentration and agitation.       Objective:    BP 112/64 mmHg  Pulse 71  Temp(Src) 98 F (36.7 C) (Oral)  Ht 5' 4"  (1.626 m)  Wt 150 lb (68.04 kg)  BMI 25.73 kg/m2  SpO2 98% General appearance: alert, cooperative, appears stated age and no distress Head: Normocephalic, without obvious abnormality, atraumatic Eyes: conjunctivae/corneas clear. PERRL, EOM's intact. Fundi benign. Ears: normal TM's and external ear canals both ears Nose: Nares normal. Septum midline. Mucosa normal. No drainage or  sinus tenderness. Throat: lips, mucosa, and tongue normal; teeth and gums normal Neck: no adenopathy, no carotid bruit, no JVD, supple, symmetrical, trachea midline and thyroid not enlarged, symmetric, no tenderness/mass/nodules Back: symmetric, no curvature. ROM normal. No CVA  tenderness. Lungs: clear to auscultation bilaterally Breasts: normal appearance, no masses or tenderness Heart: regular rate and rhythm, S1, S2 normal, no murmur, click, rub or gallop Abdomen: soft, non-tender; bowel sounds normal; no masses,  no organomegaly Pelvic: cervix normal in appearance, external genitalia normal, no adnexal masses or tenderness, no cervical motion tenderness, rectovaginal septum normal, uterus normal size, shape, and consistency, vagina normal without discharge and pap done Extremities: extremities normal, atraumatic, no cyanosis or edema Pulses: 2+ and symmetric Skin: Skin color, texture, turgor normal. No rashes or lesions Lymph nodes: Cervical, supraclavicular, and axillary nodes normal. Neurologic: Alert and oriented X 3, normal strength and tone. Normal symmetric reflexes. Normal coordination and gait Psych- no depression, no anxiety      Assessment:    Healthy female exam.       Plan:     ghm utd Check labs See After Visit Summary for Counseling Recommendations     1. Estrogen deficiency   - DG Bone Density; Future  2. Encounter for screening mammogram for malignant neoplasm of breast   - MM DIGITAL SCREENING BILATERAL; Future  3. Need for immunization against influenza   - Flu Vaccine QUAD 36+ mos IM (Fluarix)  4. Preventative health care See avs - Comp Met (CMET) - CBC with Differential/Platelet - Lipid panel - POCT urinalysis dipstick - TSH  5. Family history of diabetes mellitus   - Hemoglobin A1c  6. Screening for malignant neoplasm of cervix    - Cytology - PAP

## 2014-11-10 NOTE — Progress Notes (Signed)
Pre visit review using our clinic review tool, if applicable. No additional management support is needed unless otherwise documented below in the visit note. 

## 2014-11-10 NOTE — Patient Instructions (Addendum)
Please call the [facility] to schedule your Mammogram and Bone Density Scan. Address: [address] [place], [place] [address]  Hours:   Monday 7AM-6:30PM  Tuesday 7AM-5PM  Wednesday 7AM-5PM  Thursday 7AM-5PM  Friday 7AM-5PM  Saturday Closed  Sunday Closed  Phone: [phone]   Preventive Care for Adults A healthy lifestyle and preventive care can promote health and wellness. Preventive health guidelines for women include the following key practices.  A routine yearly physical is a good way to check with your health care provider about your health and preventive screening. It is a chance to share any concerns and updates on your health and to receive a thorough exam.  Visit your dentist for a routine exam and preventive care every 6 months. Brush your teeth twice a day and floss once a day. Good oral hygiene prevents tooth decay and gum disease.  The frequency of eye exams is based on your age, health, family medical history, use of contact lenses, and other factors. Follow your health care provider's recommendations for frequency of eye exams.  Eat a healthy diet. Foods like vegetables, fruits, whole grains, low-fat dairy products, and lean protein foods contain the nutrients you need without too many calories. Decrease your intake of foods high in solid fats, added sugars, and salt. Eat the right amount of calories for you.Get information about a proper diet from your health care provider, if necessary.  Regular physical exercise is one of the most important things you can do for your health. Most adults should get at least 150 minutes of moderate-intensity exercise (any activity that increases your heart rate and causes you to sweat) each week. In addition, most adults need muscle-strengthening exercises on 2 or more days a week.  Maintain a healthy weight. The body mass index (BMI) is a screening tool to identify possible weight problems. It provides an estimate  of body fat based on height and weight. Your health care provider can find your BMI and can help you achieve or maintain a healthy weight.For adults 20 years and older:  A BMI below 18.5 is considered underweight.  A BMI of 18.5 to 24.9 is normal.  A BMI of 25 to 29.9 is considered overweight.  A BMI of 30 and above is considered obese.  Maintain normal blood lipids and cholesterol levels by exercising and minimizing your intake of saturated fat. Eat a balanced diet with plenty of fruit and vegetables. Blood tests for lipids and cholesterol should begin at age 54 and be repeated every 5 years. If your lipid or cholesterol levels are high, you are over 50, or you are at high risk for heart disease, you may need your cholesterol levels checked more frequently.Ongoing high lipid and cholesterol levels should be treated with medicines if diet and exercise are not working.  If you smoke, find out from your health care provider how to quit. If you do not use tobacco, do not start.  Lung cancer screening is recommended for adults aged 53-80 years who are at high risk for developing lung cancer because of a history of smoking. A yearly low-dose CT scan of the lungs is recommended for people who have at least a 30-pack-year history of smoking and are a current smoker or have quit within the past 15 years. A pack year of smoking is smoking an average of 1 pack of cigarettes a day for 1 year (for example: 1 pack a day for 30 years or 2 packs  a day for 15 years). Yearly screening should continue until the smoker has stopped smoking for at least 15 years. Yearly screening should be stopped for people who develop a health problem that would prevent them from having lung cancer treatment.  If you are pregnant, do not drink alcohol. If you are breastfeeding, be very cautious about drinking alcohol. If you are not pregnant and choose to drink alcohol, do not have more than 1 drink per day. One drink is considered  to be 12 ounces (355 mL) of beer, 5 ounces (148 mL) of wine, or 1.5 ounces (44 mL) of liquor.  Avoid use of street drugs. Do not share needles with anyone. Ask for help if you need support or instructions about stopping the use of drugs.  High blood pressure causes heart disease and increases the risk of stroke. Your blood pressure should be checked at least every 1 to 2 years. Ongoing high blood pressure should be treated with medicines if weight loss and exercise do not work.  If you are 8-35 years old, ask your health care provider if you should take aspirin to prevent strokes.  Diabetes screening involves taking a blood sample to check your fasting blood sugar level. This should be done once every 3 years, after age 63, if you are within normal weight and without risk factors for diabetes. Testing should be considered at a younger age or be carried out more frequently if you are overweight and have at least 1 risk factor for diabetes.  Breast cancer screening is essential preventive care for women. You should practice "breast self-awareness." This means understanding the normal appearance and feel of your breasts and may include breast self-examination. Any changes detected, no matter how small, should be reported to a health care provider. Women in their 27s and 30s should have a clinical breast exam (CBE) by a health care provider as part of a regular health exam every 1 to 3 years. After age 63, women should have a CBE every year. Starting at age 71, women should consider having a mammogram (breast X-ray test) every year. Women who have a family history of breast cancer should talk to their health care provider about genetic screening. Women at a high risk of breast cancer should talk to their health care providers about having an MRI and a mammogram every year.  Breast cancer gene (BRCA)-related cancer risk assessment is recommended for women who have family members with BRCA-related cancers.  BRCA-related cancers include breast, ovarian, tubal, and peritoneal cancers. Having family members with these cancers may be associated with an increased risk for harmful changes (mutations) in the breast cancer genes BRCA1 and BRCA2. Results of the assessment will determine the need for genetic counseling and BRCA1 and BRCA2 testing.  Routine pelvic exams to screen for cancer are no longer recommended for nonpregnant women who are considered low risk for cancer of the pelvic organs (ovaries, uterus, and vagina) and who do not have symptoms. Ask your health care provider if a screening pelvic exam is right for you.  If you have had past treatment for cervical cancer or a condition that could lead to cancer, you need Pap tests and screening for cancer for at least 20 years after your treatment. If Pap tests have been discontinued, your risk factors (such as having a new sexual partner) need to be reassessed to determine if screening should be resumed. Some women have medical problems that increase the chance of getting cervical cancer. In these  cases, your health care provider may recommend more frequent screening and Pap tests.  The HPV test is an additional test that may be used for cervical cancer screening. The HPV test looks for the virus that can cause the cell changes on the cervix. The cells collected during the Pap test can be tested for HPV. The HPV test could be used to screen women aged 59 years and older, and should be used in women of any age who have unclear Pap test results. After the age of 59, women should have HPV testing at the same frequency as a Pap test.  Colorectal cancer can be detected and often prevented. Most routine colorectal cancer screening begins at the age of 16 years and continues through age 45 years. However, your health care provider may recommend screening at an earlier age if you have risk factors for colon cancer. On a yearly basis, your health care provider may  provide home test kits to check for hidden blood in the stool. Use of a small camera at the end of a tube, to directly examine the colon (sigmoidoscopy or colonoscopy), can detect the earliest forms of colorectal cancer. Talk to your health care provider about this at age 67, when routine screening begins. Direct exam of the colon should be repeated every 5-10 years through age 60 years, unless early forms of pre-cancerous polyps or small growths are found.  People who are at an increased risk for hepatitis B should be screened for this virus. You are considered at high risk for hepatitis B if:  You were born in a country where hepatitis B occurs often. Talk with your health care provider about which countries are considered high risk.  Your parents were born in a high-risk country and you have not received a shot to protect against hepatitis B (hepatitis B vaccine).  You have HIV or AIDS.  You use needles to inject street drugs.  You live with, or have sex with, someone who has hepatitis B.  You get hemodialysis treatment.  You take certain medicines for conditions like cancer, organ transplantation, and autoimmune conditions.  Hepatitis C blood testing is recommended for all people born from [birth year] through 1965 and any individual with known risks for hepatitis C.  Practice safe sex. Use condoms and avoid high-risk sexual practices to reduce the spread of sexually transmitted infections (STIs). STIs include gonorrhea, chlamydia, syphilis, trichomonas, herpes, HPV, and human immunodeficiency virus (HIV). Herpes, HIV, and HPV are viral illnesses that have no cure. They can result in disability, cancer, and death.  You should be screened for sexually transmitted illnesses (STIs) including gonorrhea and chlamydia if:  You are sexually active and are younger than 24 years.  You are older than 24 years and your health care provider tells you that you are at risk for this type of  infection.  Your sexual activity has changed since you were last screened and you are at an increased risk for chlamydia or gonorrhea. Ask your health care provider if you are at risk.  If you are at risk of being infected with HIV, it is recommended that you take a prescription medicine daily to prevent HIV infection. This is called preexposure prophylaxis (PrEP). You are considered at risk if:  You are a heterosexual woman, are sexually active, and are at increased risk for HIV infection.  You take drugs by injection.  You are sexually active with a partner who has HIV.  Talk with your health care provider  about whether you are at high risk of being infected with HIV. If you choose to begin PrEP, you should first be tested for HIV. You should then be tested every 3 months for as long as you are taking PrEP.  Osteoporosis is a disease in which the bones lose minerals and strength with aging. This can result in serious bone fractures or breaks. The risk of osteoporosis can be identified using a bone density scan. Women ages 95 years and over and women at risk for fractures or osteoporosis should discuss screening with their health care providers. Ask your health care provider whether you should take a calcium supplement or vitamin D to reduce the rate of osteoporosis.  Menopause can be associated with physical symptoms and risks. Hormone replacement therapy is available to decrease symptoms and risks. You should talk to your health care provider about whether hormone replacement therapy is right for you.  Use sunscreen. Apply sunscreen liberally and repeatedly throughout the day. You should seek shade when your shadow is shorter than you. Protect yourself by wearing long sleeves, pants, a wide-brimmed hat, and sunglasses year round, whenever you are outdoors.  Once a month, do a whole body skin exam, using a mirror to look at the skin on your back. Tell your health care provider of new moles,  moles that have irregular borders, moles that are larger than a pencil eraser, or moles that have changed in shape or color.  Stay current with required vaccines (immunizations).  Influenza vaccine. All adults should be immunized every year.  Tetanus, diphtheria, and acellular pertussis (Td, Tdap) vaccine. Pregnant women should receive 1 dose of Tdap vaccine during each pregnancy. The dose should be obtained regardless of the length of time since the last dose. Immunization is preferred during the 27th-36th week of gestation. An adult who has not previously received Tdap or who does not know her vaccine status should receive 1 dose of Tdap. This initial dose should be followed by tetanus and diphtheria toxoids (Td) booster doses every 10 years. Adults with an unknown or incomplete history of completing a 3-dose immunization series with Td-containing vaccines should begin or complete a primary immunization series including a Tdap dose. Adults should receive a Td booster every 10 years.  Varicella vaccine. An adult without evidence of immunity to varicella should receive 2 doses or a second dose if she has previously received 1 dose. Pregnant females who do not have evidence of immunity should receive the first dose after pregnancy. This first dose should be obtained before leaving the health care facility. The second dose should be obtained 4-8 weeks after the first dose.  Human papillomavirus (HPV) vaccine. Females aged 13-26 years who have not received the vaccine previously should obtain the 3-dose series. The vaccine is not recommended for use in pregnant females. However, pregnancy testing is not needed before receiving a dose. If a female is found to be pregnant after receiving a dose, no treatment is needed. In that case, the remaining doses should be delayed until after the pregnancy. Immunization is recommended for any person with an immunocompromised condition through the age of 75 years if she  did not get any or all doses earlier. During the 3-dose series, the second dose should be obtained 4-8 weeks after the first dose. The third dose should be obtained 24 weeks after the first dose and 16 weeks after the second dose.  Zoster vaccine. One dose is recommended for adults aged 27 years or older unless  certain conditions are present.  Measles, mumps, and rubella (MMR) vaccine. Adults born before [birth year] generally are considered immune to measles and mumps. Adults born in [birth year] or later should have 1 or more doses of MMR vaccine unless there is a contraindication to the vaccine or there is laboratory evidence of immunity to each of the three diseases. A routine second dose of MMR vaccine should be obtained at least 28 days after the first dose for students attending postsecondary schools, health care workers, or international travelers. People who received inactivated measles vaccine or an unknown type of measles vaccine during 1963-1967 should receive 2 doses of MMR vaccine. People who received inactivated mumps vaccine or an unknown type of mumps vaccine before 1979 and are at high risk for mumps infection should consider immunization with 2 doses of MMR vaccine. For females of childbearing age, rubella immunity should be determined. If there is no evidence of immunity, females who are not pregnant should be vaccinated. If there is no evidence of immunity, females who are pregnant should delay immunization until after pregnancy. Unvaccinated health care workers born before [birth year] who lack laboratory evidence of measles, mumps, or rubella immunity or laboratory confirmation of disease should consider measles and mumps immunization with 2 doses of MMR vaccine or rubella immunization with 1 dose of MMR vaccine.  Pneumococcal 13-valent conjugate (PCV13) vaccine. When indicated, a person who is uncertain of her immunization history and has no record of immunization should receive the PCV13 vaccine. An adult  aged 48 years or older who has certain medical conditions and has not been previously immunized should receive 1 dose of PCV13 vaccine. This PCV13 should be followed with a dose of pneumococcal polysaccharide (PPSV23) vaccine. The PPSV23 vaccine dose should be obtained at least 8 weeks after the dose of PCV13 vaccine. An adult aged 30 years or older who has certain medical conditions and previously received 1 or more doses of PPSV23 vaccine should receive 1 dose of PCV13. The PCV13 vaccine dose should be obtained 1 or more years after the last PPSV23 vaccine dose.  Pneumococcal polysaccharide (PPSV23) vaccine. When PCV13 is also indicated, PCV13 should be obtained first. All adults aged 58 years and older should be immunized. An adult younger than age 32 years who has certain medical conditions should be immunized. Any person who resides in a nursing home or long-term care facility should be immunized. An adult smoker should be immunized. People with an immunocompromised condition and certain other conditions should receive both PCV13 and PPSV23 vaccines. People with human immunodeficiency virus (HIV) infection should be immunized as soon as possible after diagnosis. Immunization during chemotherapy or radiation therapy should be avoided. Routine use of PPSV23 vaccine is not recommended for American Indians, [place] Natives, or people younger than 65 years unless there are medical conditions that require PPSV23 vaccine. When indicated, people who have unknown immunization and have no record of immunization should receive PPSV23 vaccine. One-time revaccination 5 years after the first dose of PPSV23 is recommended for people aged 19-64 years who have chronic kidney failure, nephrotic syndrome, asplenia, or immunocompromised conditions. People who received 1-2 doses of PPSV23 before age 48 years should receive another dose of PPSV23 vaccine at age 40 years or later if at least 5 years have passed since the previous  dose. Doses of PPSV23 are not needed for people immunized with PPSV23 at or after age 81 years.  Meningococcal vaccine. Adults with asplenia or persistent complement component deficiencies should receive 2 doses of  quadrivalent meningococcal conjugate (MenACWY-D) vaccine. The doses should be obtained at least 2 months apart. Microbiologists working with certain meningococcal bacteria, [employer], people at risk during an outbreak, and people who travel to or live in countries with a high rate of meningitis should be immunized. A first-year college student up through age 35 years who is living in a residence hall should receive a dose if she did not receive a dose on or after her 16th birthday. Adults who have certain high-risk conditions should receive one or more doses of vaccine.  Hepatitis A vaccine. Adults who wish to be protected from this disease, have certain high-risk conditions, work with hepatitis A-infected animals, work in hepatitis A research labs, or travel to or work in countries with a high rate of hepatitis A should be immunized. Adults who were previously unvaccinated and who anticipate close contact with an international adoptee during the first 60 days after arrival in the [place] from a country with a high rate of hepatitis A should be immunized.  Hepatitis B vaccine. Adults who wish to be protected from this disease, have certain high-risk conditions, may be exposed to blood or other infectious body fluids, are household contacts or sex partners of hepatitis B positive people, are clients or workers in certain care facilities, or travel to or work in countries with a high rate of hepatitis B should be immunized.  Haemophilus influenzae type b (Hib) vaccine. A previously unvaccinated person with asplenia or sickle cell disease or having a scheduled splenectomy should receive 1 dose of Hib vaccine. Regardless of previous immunization, a recipient of a hematopoietic stem cell  transplant should receive a 3-dose series 6-12 months after her successful transplant. Hib vaccine is not recommended for adults with HIV infection. Preventive Services / Frequency Ages 23 to 55 years  Blood pressure check.** / Every 1 to 2 years.  Lipid and cholesterol check.** / Every 5 years beginning at age 25.  Clinical breast exam.** / Every 3 years for women in their 71s and 91s.  BRCA-related cancer risk assessment.** / For women who have family members with a BRCA-related cancer (breast, ovarian, tubal, or peritoneal cancers).  Pap test.** / Every 2 years from ages 74 through 14. Every 3 years starting at age 52 through age 51 or 21 with a history of 3 consecutive normal Pap tests.  HPV screening.** / Every 3 years from ages 60 through ages 76 to 67 with a history of 3 consecutive normal Pap tests.  Hepatitis C blood test.** / For any individual with known risks for hepatitis C.  Skin self-exam. / Monthly.  Influenza vaccine. / Every year.  Tetanus, diphtheria, and acellular pertussis (Tdap, Td) vaccine.** / Consult your health care provider. Pregnant women should receive 1 dose of Tdap vaccine during each pregnancy. 1 dose of Td every 10 years.  Varicella vaccine.** / Consult your health care provider. Pregnant females who do not have evidence of immunity should receive the first dose after pregnancy.  HPV vaccine. / 3 doses over 6 months, if 39 and younger. The vaccine is not recommended for use in pregnant females. However, pregnancy testing is not needed before receiving a dose.  Measles, mumps, rubella (MMR) vaccine.** / You need at least 1 dose of MMR if you were born in 1957 or later. You may also need a 2nd dose. For females of childbearing age, rubella immunity should be determined. If there is no evidence of immunity, females who are not pregnant should  be vaccinated. If there is no evidence of immunity, females who are pregnant should delay immunization until after  pregnancy.  Pneumococcal 13-valent conjugate (PCV13) vaccine.** / Consult your health care provider.  Pneumococcal polysaccharide (PPSV23) vaccine.** / 1 to 2 doses if you smoke cigarettes or if you have certain conditions.  Meningococcal vaccine.** / 1 dose if you are age 69 to 43 years and a Market researcher living in a residence hall, or have one of several medical conditions, you need to get vaccinated against meningococcal disease. You may also need additional booster doses.  Hepatitis A vaccine.** / Consult your health care provider.  Hepatitis B vaccine.** / Consult your health care provider.  Haemophilus influenzae type b (Hib) vaccine.** / Consult your health care provider. Ages 18 to 10 years  Blood pressure check.** / Every 1 to 2 years.  Lipid and cholesterol check.** / Every 5 years beginning at age 13 years.  Lung cancer screening. / Every year if you are aged 36-80 years and have a 30-pack-year history of smoking and currently smoke or have quit within the past 15 years. Yearly screening is stopped once you have quit smoking for at least 15 years or develop a health problem that would prevent you from having lung cancer treatment.  Clinical breast exam.** / Every year after age 23 years.  BRCA-related cancer risk assessment.** / For women who have family members with a BRCA-related cancer (breast, ovarian, tubal, or peritoneal cancers).  Mammogram.** / Every year beginning at age 50 years and continuing for as long as you are in good health. Consult with your health care provider.  Pap test.** / Every 3 years starting at age 48 years through age 4 or 41 years with a history of 3 consecutive normal Pap tests.  HPV screening.** / Every 3 years from ages 46 years through ages 53 to 57 years with a history of 3 consecutive normal Pap tests.  Fecal occult blood test (FOBT) of stool. / Every year beginning at age 82 years and continuing until age 58 years. You may  not need to do this test if you get a colonoscopy every 10 years.  Flexible sigmoidoscopy or colonoscopy.** / Every 5 years for a flexible sigmoidoscopy or every 10 years for a colonoscopy beginning at age 61 years and continuing until age 79 years.  Hepatitis C blood test.** / For all people born from [birth year] through 1967 and any individual with known risks for hepatitis C.  Skin self-exam. / Monthly.  Influenza vaccine. / Every year.  Tetanus, diphtheria, and acellular pertussis (Tdap/Td) vaccine.** / Consult your health care provider. Pregnant women should receive 1 dose of Tdap vaccine during each pregnancy. 1 dose of Td every 10 years.  Varicella vaccine.** / Consult your health care provider. Pregnant females who do not have evidence of immunity should receive the first dose after pregnancy.  Zoster vaccine.** / 1 dose for adults aged 57 years or older.  Measles, mumps, rubella (MMR) vaccine.** / You need at least 1 dose of MMR if you were born in 1957 or later. You may also need a 2nd dose. For females of childbearing age, rubella immunity should be determined. If there is no evidence of immunity, females who are not pregnant should be vaccinated. If there is no evidence of immunity, females who are pregnant should delay immunization until after pregnancy.  Pneumococcal 13-valent conjugate (PCV13) vaccine.** / Consult your health care provider.  Pneumococcal polysaccharide (PPSV23) vaccine.** / 1 to 2  doses if you smoke cigarettes or if you have certain conditions.  Meningococcal vaccine.** / Consult your health care provider.  Hepatitis A vaccine.** / Consult your health care provider.  Hepatitis B vaccine.** / Consult your health care provider.  Haemophilus influenzae type b (Hib) vaccine.** / Consult your health care provider. Ages 90 years and over  Blood pressure check.** / Every 1 to 2 years.  Lipid and cholesterol check.** / Every 5 years beginning at age 71 years.  Lung  cancer screening. / Every year if you are aged 25-80 years and have a 30-pack-year history of smoking and currently smoke or have quit within the past 15 years. Yearly screening is stopped once you have quit smoking for at least 15 years or develop a health problem that would prevent you from having lung cancer treatment.  Clinical breast exam.** / Every year after age 2 years.  BRCA-related cancer risk assessment.** / For women who have family members with a BRCA-related cancer (breast, ovarian, tubal, or peritoneal cancers).  Mammogram.** / Every year beginning at age 107 years and continuing for as long as you are in good health. Consult with your health care provider.  Pap test.** / Every 3 years starting at age 16 years through age 46 or 37 years with 3 consecutive normal Pap tests. Testing can be stopped between 65 and 70 years with 3 consecutive normal Pap tests and no abnormal Pap or HPV tests in the past 10 years.  HPV screening.** / Every 3 years from ages 46 years through ages 25 or 18 years with a history of 3 consecutive normal Pap tests. Testing can be stopped between 65 and 70 years with 3 consecutive normal Pap tests and no abnormal Pap or HPV tests in the past 10 years.  Fecal occult blood test (FOBT) of stool. / Every year beginning at age 65 years and continuing until age 60 years. You may not need to do this test if you get a colonoscopy every 10 years.  Flexible sigmoidoscopy or colonoscopy.** / Every 5 years for a flexible sigmoidoscopy or every 10 years for a colonoscopy beginning at age 65 years and continuing until age 20 years.  Hepatitis C blood test.** / For all people born from [birth year] through 1965 and any individual with known risks for hepatitis C.  Osteoporosis screening.** / A one-time screening for women ages 39 years and over and women at risk for fractures or osteoporosis.  Skin self-exam. / Monthly.  Influenza vaccine. / Every year.  Tetanus, diphtheria, and  acellular pertussis (Tdap/Td) vaccine.** / 1 dose of Td every 10 years.  Varicella vaccine.** / Consult your health care provider.  Zoster vaccine.** / 1 dose for adults aged 36 years or older.  Pneumococcal 13-valent conjugate (PCV13) vaccine.** / Consult your health care provider.  Pneumococcal polysaccharide (PPSV23) vaccine.** / 1 dose for all adults aged 49 years and older.  Meningococcal vaccine.** / Consult your health care provider.  Hepatitis A vaccine.** / Consult your health care provider.  Hepatitis B vaccine.** / Consult your health care provider.  Haemophilus influenzae type b (Hib) vaccine.** / Consult your health care provider. ** Family history and personal history of risk and conditions may change your health care provider's recommendations. Document Released: 03/22/2001 Document Revised: 06/10/2013 Document Reviewed: 06/21/2010 [facility] Patient Information 2015 [place], [place]. This information is not intended to replace advice given to you by your health care provider. Make sure you discuss any questions you have with your health care  provider.

## 2014-11-11 LAB — CYTOLOGY - PAP

## 2014-11-13 ENCOUNTER — Telehealth: Payer: Self-pay | Admitting: Family Medicine

## 2014-11-13 NOTE — Telephone Encounter (Signed)
Pt called to f/u on lab results. I advised they will be released to my chart (as she requested) once reviewed.

## 2014-11-13 NOTE — Telephone Encounter (Signed)
Please result and release labs per patient request.      KP

## 2014-11-21 ENCOUNTER — Ambulatory Visit
Admission: RE | Admit: 2014-11-21 | Discharge: 2014-11-21 | Disposition: A | Discharge status: Home / Self Care | Payer: 59 | Source: Ambulatory Visit | Attending: Family Medicine | Admitting: Family Medicine

## 2014-11-21 DIAGNOSIS — Z1231 Encounter for screening mammogram for malignant neoplasm of breast: Secondary | ICD-10-CM

## 2014-11-21 DIAGNOSIS — E2839 Other primary ovarian failure: Secondary | ICD-10-CM

## 2014-12-17 ENCOUNTER — Encounter: Payer: Self-pay | Admitting: Family Medicine

## 2015-10-01 ENCOUNTER — Telehealth: Payer: Self-pay | Admitting: Family Medicine

## 2015-10-01 NOTE — Telephone Encounter (Signed)
Sent patient message on My Chart (Active) informing that the appointment scheduled for 11/16/2015 with Dr. Carollee Herter has been cancelled. Instructed patient to call the office to reschedule.

## 2015-10-07 DIAGNOSIS — F4320 Adjustment disorder, unspecified: Secondary | ICD-10-CM | POA: Diagnosis not present

## 2015-11-05 NOTE — Telephone Encounter (Signed)
Contacted patient to reschedule appointment scheduled for 10/9 with Dr. Carollee Herter. Patient stated the appointment can be cancelled and she will call later to reschedule

## 2015-11-16 ENCOUNTER — Encounter: Payer: 59 | Admitting: Family Medicine

## 2016-01-19 DIAGNOSIS — H5213 Myopia, bilateral: Secondary | ICD-10-CM | POA: Diagnosis not present

## 2016-02-15 ENCOUNTER — Ambulatory Visit (INDEPENDENT_AMBULATORY_CARE_PROVIDER_SITE_OTHER): Payer: 59 | Admitting: Family Medicine

## 2016-02-15 ENCOUNTER — Encounter: Payer: Self-pay | Admitting: Family Medicine

## 2016-02-15 VITALS — BP 125/66 | HR 85 | Temp 98.5°F | Resp 16 | Ht 63.5 in | Wt 155.4 lb

## 2016-02-15 DIAGNOSIS — Z0000 Encounter for general adult medical examination without abnormal findings: Secondary | ICD-10-CM

## 2016-02-15 DIAGNOSIS — Z1159 Encounter for screening for other viral diseases: Secondary | ICD-10-CM

## 2016-02-15 DIAGNOSIS — Z833 Family history of diabetes mellitus: Secondary | ICD-10-CM | POA: Diagnosis not present

## 2016-02-15 NOTE — Patient Instructions (Signed)
[facility]  [id number]   Preventive Care 40-64 Years, Female Preventive care refers to lifestyle choices and visits with your health care provider that can promote health and wellness. What does preventive care include?  A yearly physical exam. This is also called an annual well check.  Dental exams once or twice a year.  Routine eye exams. Ask your health care provider how often you should have your eyes checked.  Personal lifestyle choices, including:  Daily care of your teeth and gums.  Regular physical activity.  Eating a healthy diet.  Avoiding tobacco and drug use.  Limiting alcohol use.  Practicing safe sex.  Taking low-dose aspirin daily starting at age 70.  Taking vitamin and mineral supplements as recommended by your health care provider. What happens during an annual well check? The services and screenings done by your health care provider during your annual well check will depend on your age, overall health, lifestyle risk factors, and family history of disease. Counseling  Your health care provider may ask you questions about your:  Alcohol use.  Tobacco use.  Drug use.  Emotional well-being.  Home and relationship well-being.  Sexual activity.  Eating habits.  Work and work Statistician.  Method of birth control.  Menstrual cycle.  Pregnancy history. Screening  You may have the following tests or measurements:  Height, weight, and BMI.  Blood pressure.  Lipid and cholesterol levels. These may be checked every 5 years, or more frequently if you are over 34 years old.  Skin check.  Lung cancer screening. You may have this screening every year starting at age 80 if you have a 30-pack-year history of smoking and currently smoke or have quit within the past 15 years.  Fecal occult blood test (FOBT) of the stool. You may have this test every year starting at age 38.  Flexible sigmoidoscopy or colonoscopy. You may  have a sigmoidoscopy every 5 years or a colonoscopy every 10 years starting at age 18.  Hepatitis C blood test.  Hepatitis B blood test.  Sexually transmitted disease (STD) testing.  Diabetes screening. This is done by checking your blood sugar (glucose) after you have not eaten for a while (fasting). You may have this done every 1-3 years.  Mammogram. This may be done every 1-2 years. Talk to your health care provider about when you should start having regular mammograms. This may depend on whether you have a family history of breast cancer.  BRCA-related cancer screening. This may be done if you have a family history of breast, ovarian, tubal, or peritoneal cancers.  Pelvic exam and Pap test. This may be done every 3 years starting at age 12. Starting at age 88, this may be done every 5 years if you have a Pap test in combination with an HPV test.  Bone density scan. This is done to screen for osteoporosis. You may have this scan if you are at high risk for osteoporosis. Discuss your test results, treatment options, and if necessary, the need for more tests with your health care provider. Vaccines  Your health care provider may recommend certain vaccines, such as:  Influenza vaccine. This is recommended every year.  Tetanus, diphtheria, and acellular pertussis (Tdap, Td) vaccine. You may need a Td booster every 10 years.  Varicella vaccine. You may need this if you have not been vaccinated.  Zoster vaccine. You may need this after age 36.  Measles, mumps, and rubella (MMR) vaccine. You may need  at least one dose of MMR if you were born in 1957 or later. You may also need a second dose.  Pneumococcal 13-valent conjugate (PCV13) vaccine. You may need this if you have certain conditions and were not previously vaccinated.  Pneumococcal polysaccharide (PPSV23) vaccine. You may need one or two doses if you smoke cigarettes or if you have certain conditions.  Meningococcal vaccine. You  may need this if you have certain conditions.  Hepatitis A vaccine. You may need this if you have certain conditions or if you travel or work in places where you may be exposed to hepatitis A.  Hepatitis B vaccine. You may need this if you have certain conditions or if you travel or work in places where you may be exposed to hepatitis B.  Haemophilus influenzae type b (Hib) vaccine. You may need this if you have certain conditions. Talk to your health care provider about which screenings and vaccines you need and how often you need them. This information is not intended to replace advice given to you by your health care provider. Make sure you discuss any questions you have with your health care provider. Document Released: 02/20/2015 Document Revised: 10/14/2015 Document Reviewed: 11/25/2014 Elsevier Interactive Patient Education  2017 [employer].

## 2016-02-15 NOTE — Progress Notes (Signed)
Subjective:     Olivia [name] is a 61 y.o. female and is here for a comprehensive physical exam. The patient reports no problems.  Social History   Social History  . Marital status: Married    Spouse name: N/A  . Number of children: 2  . Years of education: N/A   Occupational History  . 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 Smoking status: Never Smoker  . Smokeless tobacco: Never Used  . Alcohol use 1.0 oz/week    2 Standard drinks or equivalent per week  . Drug use: No  . Sexual activity: Yes    Partners: Male   Other Topics Concern  . Not on file   Social History Narrative   Exercise---  3-4 x a week   Health Maintenance  Topic Date Due  . Hepatitis C Screening  [birth year]  . HIV Screening  01/25/1969  . ZOSTAVAX  01/25/2014  . MAMMOGRAM  11/21/2015  . COLONOSCOPY  11/22/2015  . PAP SMEAR  11/09/2017  . TETANUS/TDAP  09/07/2020  . INFLUENZA VACCINE  Completed    The following portions of the patient's history were reviewed and updated as appropriate: She  has no past medical history on file. She  does not have a problem list on file. She  has no past surgical history on file. Her family history includes Arthritis in her mother; Cancer in her father; Dementia in her mother; Diabetes in her father; Heart disease in her father; Hypertension in her father; Stroke in her father. She  reports that she has never smoked. She has never used smokeless tobacco. She reports that she drinks about 1.0 oz of alcohol per week . She reports that she does not use drugs. She has a current medication list which includes the following prescription(s): aspirin-acetaminophen-caffeine. Current Outpatient Prescriptions on File Prior to Visit  Medication Sig Dispense Refill  . aspirin-acetaminophen-caffeine (EXCEDRIN MIGRAINE) 250-250-65 MG per tablet Take 1 tablet by mouth every 6 (six) hours as needed.     No current facility-administered medications on file prior to  visit.    She has No Known Allergies..  Review of Systems Review of Systems  Constitutional: Negative for activity change, appetite change and fatigue.  HENT: Negative for hearing loss, congestion, tinnitus and ear discharge.  dentist q50m Eyes: Negative for visual disturbance (see optho q1y -- vision corrected to 20/20 with glasses).  Respiratory: Negative for cough, chest tightness and shortness of breath.   Cardiovascular: Negative for chest pain, palpitations and leg swelling.  Gastrointestinal: Negative for abdominal pain, diarrhea, constipation and abdominal distention.  Genitourinary: Negative for urgency, frequency, decreased urine volume and difficulty urinating.  Musculoskeletal: Negative for back pain, arthralgias and gait problem.  Skin: Negative for color change, pallor and rash.  Neurological: Negative for dizziness, light-headedness, numbness and headaches.  Hematological: Negative for adenopathy. Does not bruise/bleed easily.  Psychiatric/Behavioral: Negative for suicidal ideas, confusion, sleep disturbance, self-injury, dysphoric mood, decreased concentration and agitation.       Objective:    BP 125/66 (BP Location: Right Arm, Cuff Size: Normal)   Pulse 85   Temp 98.5 F (36.9 C) (Oral)   Resp 16   Ht 5' 3.5" (1.613 m)   Wt 155 lb 6.4 oz (70.5 kg)   SpO2 99%   BMI 27.10 kg/m  General appearance: alert, cooperative, appears stated age and no distress Head: Normocephalic, without obvious abnormality, atraumatic Eyes: conjunctivae/corneas clear. PERRL, EOM's intact. Fundi benign. Ears: normal  TM's and external ear canals both ears Nose: Nares normal. Septum midline. Mucosa normal. No drainage or sinus tenderness. Throat: lips, mucosa, and tongue normal; teeth and gums normal Neck: no adenopathy, no carotid bruit, no JVD, supple, symmetrical, trachea midline and thyroid not enlarged, symmetric, no tenderness/mass/nodules Back: symmetric, no curvature. ROM  normal. No CVA tenderness. Lungs: clear to auscultation bilaterally Breasts: normal appearance, no masses or tenderness Heart: regular rate and rhythm, S1, S2 normal, no murmur, click, rub or gallop Abdomen: soft, non-tender; bowel sounds normal; no masses,  no organomegaly Pelvic: deferred Extremities: extremities normal, atraumatic, no cyanosis or edema Pulses: 2+ and symmetric Skin: Skin color, texture, turgor normal. No rashes or lesions Lymph nodes: Cervical, supraclavicular, and axillary nodes normal. Neurologic: Alert and oriented X 3, normal strength and tone. Normal symmetric reflexes. Normal coordination and gait    Assessment:    Healthy female exam.      Plan:    ghm utd Check labs See After Visit Summary for Counseling Recommendations    1. Preventative health care See above  - Ambulatory referral to Gastroenterology - Lipid panel; Future - Comprehensive metabolic panel; Future - CBC with Differential/Platelet; Future - POCT urinalysis dipstick; Future - TSH; Future  2. Need for hepatitis C screening test    - Hepatitis C antibody; Future  3. Family history of diabetes mellitus (DM)   - Hemoglobin A1c; Future

## 2016-02-15 NOTE — Progress Notes (Signed)
Pre visit review using our clinic review tool, if applicable. No additional management support is needed unless otherwise documented below in the visit note. 

## 2016-02-16 ENCOUNTER — Other Ambulatory Visit: Payer: 59

## 2016-02-16 ENCOUNTER — Other Ambulatory Visit (INDEPENDENT_AMBULATORY_CARE_PROVIDER_SITE_OTHER): Payer: 59

## 2016-02-16 DIAGNOSIS — Z1159 Encounter for screening for other viral diseases: Secondary | ICD-10-CM

## 2016-02-16 DIAGNOSIS — Z833 Family history of diabetes mellitus: Secondary | ICD-10-CM | POA: Diagnosis not present

## 2016-02-16 DIAGNOSIS — Z0000 Encounter for general adult medical examination without abnormal findings: Secondary | ICD-10-CM | POA: Diagnosis not present

## 2016-02-16 DIAGNOSIS — R82998 Other abnormal findings in urine: Secondary | ICD-10-CM

## 2016-02-16 DIAGNOSIS — R8299 Other abnormal findings in urine: Secondary | ICD-10-CM | POA: Diagnosis not present

## 2016-02-16 LAB — CBC WITH DIFFERENTIAL/PLATELET
BASOS ABS: 0 10*3/uL (ref 0.0–0.1)
Basophils Relative: 0.6 % (ref 0.0–3.0)
EOS PCT: 3.9 % (ref 0.0–5.0)
Eosinophils Absolute: 0.3 10*3/uL (ref 0.0–0.7)
HCT: 40.8 % (ref 36.0–46.0)
HEMOGLOBIN: 14 g/dL (ref 12.0–15.0)
Lymphocytes Relative: 30.2 % (ref 12.0–46.0)
Lymphs Abs: 2 10*3/uL (ref 0.7–4.0)
MCHC: 34.3 g/dL (ref 30.0–36.0)
MCV: 87.3 fl (ref 78.0–100.0)
MONO ABS: 0.5 10*3/uL (ref 0.1–1.0)
Monocytes Relative: 7.7 % (ref 3.0–12.0)
Neutro Abs: 3.9 10*3/uL (ref 1.4–7.7)
Neutrophils Relative %: 57.6 % (ref 43.0–77.0)
Platelets: 236 10*3/uL (ref 150.0–400.0)
RBC: 4.67 Mil/uL (ref 3.87–5.11)
RDW: 12.9 % (ref 11.5–15.5)
WBC: 6.8 10*3/uL (ref 4.0–10.5)

## 2016-02-16 LAB — COMPREHENSIVE METABOLIC PANEL
ALT: 23 U/L (ref 0–35)
AST: 21 U/L (ref 0–37)
Albumin: 4.1 g/dL (ref 3.5–5.2)
Alkaline Phosphatase: 83 U/L (ref 39–117)
BILIRUBIN TOTAL: 0.4 mg/dL (ref 0.2–1.2)
BUN: 17 mg/dL (ref 6–23)
CO2: 29 mEq/L (ref 19–32)
Calcium: 9.3 mg/dL (ref 8.4–10.5)
Chloride: 104 mEq/L (ref 96–112)
Creatinine, Ser: 0.69 mg/dL (ref 0.40–1.20)
GFR: 91.61 mL/min (ref >60.00)
Glucose, Bld: 88 mg/dL (ref 70–99)
POTASSIUM: 4 meq/L (ref 3.5–5.1)
SODIUM: 140 meq/L (ref 135–145)
Total Protein: 7.2 g/dL (ref 6.0–8.3)

## 2016-02-16 LAB — LIPID PANEL
CHOL/HDL RATIO: 4
Cholesterol: 237 mg/dL — ABNORMAL HIGH (ref 0–200)
HDL: 61.8 mg/dL (ref >39.00)
LDL Cholesterol: 147 mg/dL — ABNORMAL HIGH (ref 0–99)
NonHDL: 175.5
TRIGLYCERIDES: 143 mg/dL (ref 0.0–149.0)
VLDL: 28.6 mg/dL (ref 0.0–40.0)

## 2016-02-16 LAB — POCT URINALYSIS DIPSTICK
Bilirubin, UA: NEGATIVE
Blood, UA: NEGATIVE
Glucose, UA: NEGATIVE
Ketones, UA: NEGATIVE
Nitrite, UA: NEGATIVE
PH UA: 6.5
PROTEIN UA: NEGATIVE
Spec Grav, UA: 1.025
UROBILINOGEN UA: 0.2

## 2016-02-16 LAB — TSH: TSH: 3.36 u[IU]/mL (ref 0.35–4.50)

## 2016-02-16 LAB — HEMOGLOBIN A1C: HEMOGLOBIN A1C: 5.7 % (ref 4.6–6.5)

## 2016-02-16 LAB — HEPATITIS C ANTIBODY: HCV Ab: NEGATIVE

## 2016-02-17 LAB — URINE CULTURE

## 2016-02-19 ENCOUNTER — Encounter: Payer: Self-pay | Admitting: Family Medicine

## 2016-04-04 DIAGNOSIS — K649 Unspecified hemorrhoids: Secondary | ICD-10-CM | POA: Diagnosis not present

## 2016-04-04 DIAGNOSIS — K640 First degree hemorrhoids: Secondary | ICD-10-CM | POA: Diagnosis not present

## 2016-04-04 DIAGNOSIS — D125 Benign neoplasm of sigmoid colon: Secondary | ICD-10-CM | POA: Diagnosis not present

## 2016-04-04 DIAGNOSIS — D122 Benign neoplasm of ascending colon: Secondary | ICD-10-CM | POA: Diagnosis not present

## 2016-04-04 DIAGNOSIS — K635 Polyp of colon: Secondary | ICD-10-CM | POA: Diagnosis not present

## 2016-04-04 DIAGNOSIS — Z1211 Encounter for screening for malignant neoplasm of colon: Secondary | ICD-10-CM | POA: Diagnosis not present

## 2016-05-30 DIAGNOSIS — H2513 Age-related nuclear cataract, bilateral: Secondary | ICD-10-CM | POA: Diagnosis not present

## 2016-08-08 ENCOUNTER — Other Ambulatory Visit: Payer: Self-pay | Admitting: Family Medicine

## 2016-08-08 DIAGNOSIS — Z1231 Encounter for screening mammogram for malignant neoplasm of breast: Secondary | ICD-10-CM

## 2016-08-19 ENCOUNTER — Ambulatory Visit
Admission: RE | Admit: 2016-08-19 | Discharge: 2016-08-19 | Disposition: A | Discharge status: Home / Self Care | Payer: 59 | Source: Ambulatory Visit | Attending: Family Medicine | Admitting: Family Medicine

## 2016-08-19 DIAGNOSIS — Z1231 Encounter for screening mammogram for malignant neoplasm of breast: Secondary | ICD-10-CM

## 2016-10-17 ENCOUNTER — Ambulatory Visit (INDEPENDENT_AMBULATORY_CARE_PROVIDER_SITE_OTHER): Payer: 59 | Admitting: Family Medicine

## 2016-10-17 ENCOUNTER — Encounter: Payer: Self-pay | Admitting: Family Medicine

## 2016-10-17 VITALS — BP 116/64 | HR 82 | Temp 97.9°F | Ht 63.5 in | Wt 156.8 lb

## 2016-10-17 DIAGNOSIS — G4452 New daily persistent headache (NDPH): Secondary | ICD-10-CM | POA: Diagnosis not present

## 2016-10-17 DIAGNOSIS — G43009 Migraine without aura, not intractable, without status migrainosus: Secondary | ICD-10-CM

## 2016-10-17 NOTE — Progress Notes (Signed)
Patient ID: Olivia [name], female    DOB: 1955/10/18  Age: 61 y.o. MRN: [id number]    Subjective:  Subjective  HPI Olivia [name] presents for vertigo that started about 3 weeks ago --- then it resolved but she started having severe headache like her hair was being pulled back.  The dizziness went away but she still gets it when she goes to bed at night for a few seconds and she coughs.  The headaches were 6-7/10.  Headaches lasted hours but have shortened and since have started responding to excedrin migraine.   She is concerned because several family members have aneurysms.     Review of Systems  Constitutional: Negative for appetite change, diaphoresis, fatigue and unexpected weight change.  Eyes: Negative for pain, redness and visual disturbance.  Respiratory: Negative for cough, chest tightness, shortness of breath and wheezing.   Cardiovascular: Negative for chest pain, palpitations and leg swelling.  Endocrine: Negative for cold intolerance, heat intolerance, polydipsia, polyphagia and polyuria.  Genitourinary: Negative for difficulty urinating, dysuria and frequency.  Neurological: Positive for dizziness, light-headedness and headaches. Negative for numbness.    History No past medical history on file.  She has no past surgical history on file.   Her family history includes Arthritis in her mother; Cancer in her father; Dementia in her mother; Diabetes in her father; Heart disease in her father; Hypertension in her father; Stroke in her father.She reports that she has never smoked. She has never used smokeless tobacco. She reports that she drinks about 1.0 oz of alcohol per week . She reports that she does not use drugs.  Current Outpatient Prescriptions on File Prior to Visit  Medication Sig Dispense Refill  . aspirin-acetaminophen-caffeine (EXCEDRIN MIGRAINE) 250-250-65 MG per tablet Take 1 tablet by mouth every 6 (six) hours as needed.     No current  facility-administered medications on file prior to visit.      Objective:  Objective  Physical Exam  Constitutional: She is oriented to person, place, and time. She appears well-developed and well-nourished.  HENT:  Head: Normocephalic and atraumatic.  Eyes: Conjunctivae and EOM are normal.  Neck: Normal range of motion. Neck supple. No JVD present. Carotid bruit is not present. No thyromegaly present.  Cardiovascular: Normal rate, regular rhythm and normal heart sounds.   No murmur heard. Pulmonary/Chest: Effort normal and breath sounds normal. No respiratory distress. She has no wheezes. She has no rales. She exhibits no tenderness.  Musculoskeletal: She exhibits no edema.  Neurological: She is alert and oriented to person, place, and time.  Psychiatric: She has a normal mood and affect.  Nursing note and vitals reviewed.  BP 116/64 (BP Location: Left Arm, Patient Position: Sitting, Cuff Size: Normal)   Pulse 82   Temp 97.9 F (36.6 C) (Oral)   Ht 5' 3.5" (1.613 m)   Wt 156 lb 12.8 oz (71.1 kg)   SpO2 99%   BMI 27.34 kg/m  Wt Readings from Last 3 Encounters:  10/17/16 156 lb 12.8 oz (71.1 kg)  02/15/16 155 lb 6.4 oz (70.5 kg)  11/10/14 150 lb (68 kg)     Lab Results  Component Value Date   WBC 6.8 02/16/2016   HGB 14.0 02/16/2016   HCT 40.8 02/16/2016   PLT 236.0 02/16/2016   GLUCOSE 88 02/16/2016   CHOL 237 (H) 02/16/2016   TRIG 143.0 02/16/2016   HDL 61.80 02/16/2016   LDLDIRECT 164.5 11/21/2012   LDLCALC 147 (H) 02/16/2016   ALT  23 02/16/2016   AST 21 02/16/2016   NA 140 02/16/2016   K 4.0 02/16/2016   CL 104 02/16/2016   CREATININE 0.69 02/16/2016   BUN 17 02/16/2016   CO2 29 02/16/2016   TSH 3.36 02/16/2016   HGBA1C 5.7 02/16/2016   MICROALBUR 0.1 11/21/2012    Mm Digital Screening Bilateral  Result Date: 08/22/2016 CLINICAL DATA:  Screening. EXAM: DIGITAL SCREENING BILATERAL MAMMOGRAM WITH CAD COMPARISON:  Previous exam(s). ACR Breast Density  Category c: The breast tissue is heterogeneously dense, which may obscure small masses. FINDINGS: There are no findings suspicious for malignancy. Images were processed with CAD. IMPRESSION: No mammographic evidence of malignancy. A result letter of this screening mammogram will be mailed directly to the patient. RECOMMENDATION: Screening mammogram in one year. (Code:SM-B-01Y) BI-RADS CATEGORY  1: Negative. Electronically Signed   By: Ammie Ferrier M.D.   On: 08/22/2016 10:30     Assessment & Plan:  Plan  I am having Ms. Dilorenzo maintain her aspirin-acetaminophen-caffeine.  No orders of the defined types were placed in this encounter.   Problem List Items Addressed This Visit      Unprioritized   Migraine without aura and without status migrainosus, not intractable - Primary    Worsening -- it is better now but symptoms were so severe and with family hx aneurysm  Check mri-- consider neuro       Relevant Orders   CBC with Differential/Platelet   Comprehensive metabolic panel   Sedimentation rate    Other Visit Diagnoses    New daily persistent headache       Relevant Orders   MR BRAIN WO CONTRAST   CBC with Differential/Platelet   Comprehensive metabolic panel   Sedimentation rate      Follow-up: Return if symptoms worsen or fail to improve.  Ann Held, DO

## 2016-10-17 NOTE — Patient Instructions (Signed)
Dizziness Dizziness is a common problem. It is a feeling of unsteadiness or light-headedness. You may feel like you are about to faint. Dizziness can lead to injury if you stumble or fall. Anyone can become dizzy, but dizziness is more common in older adults. This condition can be caused by a number of things, including medicines, dehydration, or illness. Follow these instructions at home: Taking these steps may help with your condition: Eating and drinking   Drink enough fluid to keep your urine clear or pale yellow. This helps to keep you from becoming dehydrated. Try to drink more clear fluids, such as water.  Do not drink alcohol.  Limit your caffeine intake if directed by your health care provider.  Limit your salt intake if directed by your health care provider. Activity   Avoid making quick movements.  Rise slowly from chairs and steady yourself until you feel okay.  In the morning, first sit up on the side of the bed. When you feel okay, stand slowly while you hold onto something until you know that your balance is fine.  Move your legs often if you need to stand in one place for a long time. Tighten and relax your muscles in your legs while you are standing.  Do not drive or operate heavy machinery if you feel dizzy.  Avoid bending down if you feel dizzy. Place items in your home so that they are easy for you to reach without leaning over. Lifestyle   Do not use any tobacco products, including cigarettes, chewing tobacco, or electronic cigarettes. If you need help quitting, ask your health care provider.  Try to reduce your stress level, such as with yoga or meditation. Talk with your health care provider if you need help. General instructions   Watch your dizziness for any changes.  Take medicines only as directed by your health care provider. Talk with your health care provider if you think that your dizziness is caused by a medicine that you are taking.  Tell a friend  or a family member that you are feeling dizzy. If he or she notices any changes in your behavior, have this person call your health care provider.  Keep all follow-up visits as directed by your health care provider. This is important. Contact a health care provider if:  Your dizziness does not go away.  Your dizziness or light-headedness gets worse.  You feel nauseous.  You have reduced hearing.  You have new symptoms.  You are unsteady on your feet or you feel like the room is spinning. Get help right away if:  You vomit or have diarrhea and are unable to eat or drink anything.  You have problems talking, walking, swallowing, or using your arms, hands, or legs.  You feel generally weak.  You are not thinking clearly or you have trouble forming sentences. It may take a friend or family member to notice this.  You have chest pain, abdominal pain, shortness of breath, or sweating.  Your vision changes.  You notice any bleeding.  You have a headache.  You have neck pain or a stiff neck.  You have a fever. This information is not intended to replace advice given to you by your health care provider. Make sure you discuss any questions you have with your health care provider. Document Released: 07/20/2000 Document Revised: 07/02/2015 Document Reviewed: 01/20/2014 Elsevier Interactive Patient Education  2017 [employer]  

## 2016-10-17 NOTE — Assessment & Plan Note (Signed)
Worsening -- it is better now but symptoms were so severe and with family hx aneurysm  Check mri-- consider neuro

## 2016-10-18 LAB — CBC WITH DIFFERENTIAL/PLATELET
BASOS PCT: 0.9 % (ref 0.0–3.0)
Basophils Absolute: 0.1 10*3/uL (ref 0.0–0.1)
Eosinophils Absolute: 0.3 10*3/uL (ref 0.0–0.7)
Eosinophils Relative: 3.4 % (ref 0.0–5.0)
HEMATOCRIT: 42.4 % (ref 36.0–46.0)
HEMOGLOBIN: 14.2 g/dL (ref 12.0–15.0)
LYMPHS PCT: 29.6 % (ref 12.0–46.0)
Lymphs Abs: 2.4 10*3/uL (ref 0.7–4.0)
MCHC: 33.6 g/dL (ref 30.0–36.0)
MCV: 89.6 fl (ref 78.0–100.0)
MONOS PCT: 8.6 % (ref 3.0–12.0)
Monocytes Absolute: 0.7 10*3/uL (ref 0.1–1.0)
Neutro Abs: 4.7 10*3/uL (ref 1.4–7.7)
Neutrophils Relative %: 57.5 % (ref 43.0–77.0)
Platelets: 274 10*3/uL (ref 150.0–400.0)
RBC: 4.73 Mil/uL (ref 3.87–5.11)
RDW: 12.8 % (ref 11.5–15.5)
WBC: 8.1 10*3/uL (ref 4.0–10.5)

## 2016-10-18 LAB — COMPREHENSIVE METABOLIC PANEL
ALBUMIN: 4.4 g/dL (ref 3.5–5.2)
ALT: 17 U/L (ref 0–35)
AST: 18 U/L (ref 0–37)
Alkaline Phosphatase: 79 U/L (ref 39–117)
BILIRUBIN TOTAL: 0.3 mg/dL (ref 0.2–1.2)
BUN: 17 mg/dL (ref 6–23)
CALCIUM: 9.5 mg/dL (ref 8.4–10.5)
CHLORIDE: 101 meq/L (ref 96–112)
CO2: 29 mEq/L (ref 19–32)
CREATININE: 0.79 mg/dL (ref 0.40–1.20)
GFR: 78.19 mL/min (ref >60.00)
Glucose, Bld: 84 mg/dL (ref 70–99)
Potassium: 4.1 mEq/L (ref 3.5–5.1)
Sodium: 138 mEq/L (ref 135–145)
Total Protein: 7.5 g/dL (ref 6.0–8.3)

## 2016-10-18 LAB — SEDIMENTATION RATE: SED RATE: 26 mm/h (ref 0–30)

## 2016-10-21 ENCOUNTER — Telehealth: Payer: Self-pay | Admitting: Family Medicine

## 2016-10-21 NOTE — Telephone Encounter (Signed)
Faxed medical release form to [facility] @ [phone]

## 2017-02-21 ENCOUNTER — Encounter: Payer: 59 | Admitting: Family Medicine

## 2017-04-21 ENCOUNTER — Encounter: Payer: Self-pay | Admitting: Family Medicine

## 2017-04-21 ENCOUNTER — Ambulatory Visit (INDEPENDENT_AMBULATORY_CARE_PROVIDER_SITE_OTHER): Payer: No Typology Code available for payment source | Admitting: Family Medicine

## 2017-04-21 VITALS — BP 122/62 | HR 86 | Temp 97.7°F | Resp 16 | Ht 63.39 in | Wt 162.8 lb

## 2017-04-21 DIAGNOSIS — Z0000 Encounter for general adult medical examination without abnormal findings: Secondary | ICD-10-CM | POA: Diagnosis not present

## 2017-04-21 DIAGNOSIS — Z23 Encounter for immunization: Secondary | ICD-10-CM

## 2017-04-21 DIAGNOSIS — R739 Hyperglycemia, unspecified: Secondary | ICD-10-CM

## 2017-04-21 MED ORDER — ZOSTER VAC RECOMB ADJUVANTED 50 MCG/0.5ML IM SUSR: 0.5000 mL | Freq: Once | INTRAMUSCULAR | 1 refills | Status: AC

## 2017-04-21 MED ORDER — ZOSTER VAC RECOMB ADJUVANTED 50 MCG/0.5ML IM SUSR: 0.5000 mL | Freq: Once | INTRAMUSCULAR | 0 refills | Status: DC

## 2017-04-21 NOTE — Patient Instructions (Signed)
Preventive Care 40-64 Years, Female Preventive care refers to lifestyle choices and visits with your health care provider that can promote health and wellness. What does preventive care include?  A yearly physical exam. This is also called an annual well check.  Dental exams once or twice a year.  Routine eye exams. Ask your health care provider how often you should have your eyes checked.  Personal lifestyle choices, including: ? Daily care of your teeth and gums. ? Regular physical activity. ? Eating a healthy diet. ? Avoiding tobacco and drug use. ? Limiting alcohol use. ? Practicing safe sex. ? Taking low-dose aspirin daily starting at age 58. ? Taking vitamin and mineral supplements as recommended by your health care provider. What happens during an annual well check? The services and screenings done by your health care provider during your annual well check will depend on your age, overall health, lifestyle risk factors, and family history of disease. Counseling Your health care provider may ask you questions about your:  Alcohol use.  Tobacco use.  Drug use.  Emotional well-being.  Home and relationship well-being.  Sexual activity.  Eating habits.  Work and work Statistician.  Method of birth control.  Menstrual cycle.  Pregnancy history.  Screening You may have the following tests or measurements:  Height, weight, and BMI.  Blood pressure.  Lipid and cholesterol levels. These may be checked every 5 years, or more frequently if you are over 81 years old.  Skin check.  Lung cancer screening. You may have this screening every year starting at age 78 if you have a 30-pack-year history of smoking and currently smoke or have quit within the past 15 years.  Fecal occult blood test (FOBT) of the stool. You may have this test every year starting at age 65.  Flexible sigmoidoscopy or colonoscopy. You may have a sigmoidoscopy every 5 years or a colonoscopy  every 10 years starting at age 30.  Hepatitis C blood test.  Hepatitis B blood test.  Sexually transmitted disease (STD) testing.  Diabetes screening. This is done by checking your blood sugar (glucose) after you have not eaten for a while (fasting). You may have this done every 1-3 years.  Mammogram. This may be done every 1-2 years. Talk to your health care provider about when you should start having regular mammograms. This may depend on whether you have a family history of breast cancer.  BRCA-related cancer screening. This may be done if you have a family history of breast, ovarian, tubal, or peritoneal cancers.  Pelvic exam and Pap test. This may be done every 3 years starting at age 80. Starting at age 36, this may be done every 5 years if you have a Pap test in combination with an HPV test.  Bone density scan. This is done to screen for osteoporosis. You may have this scan if you are at high risk for osteoporosis.  Discuss your test results, treatment options, and if necessary, the need for more tests with your health care provider. Vaccines Your health care provider may recommend certain vaccines, such as:  Influenza vaccine. This is recommended every year.  Tetanus, diphtheria, and acellular pertussis (Tdap, Td) vaccine. You may need a Td booster every 10 years.  Varicella vaccine. You may need this if you have not been vaccinated.  Zoster vaccine. You may need this after age 5.  Measles, mumps, and rubella (MMR) vaccine. You may need at least one dose of MMR if you were born in  1959 or later. You may also need a second dose.  Pneumococcal 13-valent conjugate (PCV13) vaccine. You may need this if you have certain conditions and were not previously vaccinated.  Pneumococcal polysaccharide (PPSV23) vaccine. You may need one or two doses if you smoke cigarettes or if you have certain conditions.  Meningococcal vaccine. You may need this if you have certain  conditions.  Hepatitis A vaccine. You may need this if you have certain conditions or if you travel or work in places where you may be exposed to hepatitis A.  Hepatitis B vaccine. You may need this if you have certain conditions or if you travel or work in places where you may be exposed to hepatitis B.  Haemophilus influenzae type b (Hib) vaccine. You may need this if you have certain conditions.  Talk to your health care provider about which screenings and vaccines you need and how often you need them. This information is not intended to replace advice given to you by your health care provider. Make sure you discuss any questions you have with your health care provider. Document Released: 02/20/2015 Document Revised: 10/14/2015 Document Reviewed: 11/25/2014 Elsevier Interactive Patient Education  2018 Elsevier Inc.  

## 2017-04-21 NOTE — Assessment & Plan Note (Signed)
ghm utd Check labs D/w shingrix See AVS

## 2017-04-21 NOTE — Progress Notes (Signed)
Subjective:  I acted as a Education administrator for [employer]. Yancey Flemings, [place]   Patient ID: Olivia [name], female    DOB: [birth year], 62 y.o.   MRN: [id number]  Chief Complaint  Patient presents with  . Annual Exam    HPI  Patient is in today for annual exam.     No complaints  Patient Care Team: Carollee Herter, Alferd Apa, DO as PCP - General (Family Medicine) de Zenia Resides, [place], OD (Optometry) Lake Bells., MD as Referring Physician (Gastroenterology)   History reviewed. No pertinent past medical history.  History reviewed. No pertinent surgical history.  Family History  Problem Relation Age of Onset  . Arthritis Mother   . Dementia Mother   . Heart disease Father   . Stroke Father   . Hypertension Father   . Diabetes Father   . Cancer Father        liver    Social History   Socioeconomic History  . Marital status: Married    Spouse name: Not on file  . Number of children: 2  . Years of education: Not on file  . Highest education level: Not on file  Social Needs  . Financial resource strain: Not on file  . Food insecurity - worry: Not on file  . Food insecurity - inability: Not on file  . Transportation needs - medical: Not on file  . Transportation needs - non-medical: Not on file  Occupational History  . Occupation: Chartered certified accountant: [employer]  Tobacco Use  . Smoking status: Never Smoker  . Smokeless tobacco: Never Used  Substance and Sexual Activity  . Alcohol use: Yes    Alcohol/week: 1.0 oz    Types: 2 Standard drinks or equivalent per week  . Drug use: No  . Sexual activity: Yes    Partners: Male  Other Topics Concern  . Not on file  Social History Narrative   Exercise---  3-4 x a week    Outpatient Medications Prior to Visit  Medication Sig Dispense Refill  . aspirin-acetaminophen-caffeine (EXCEDRIN MIGRAINE) 250-250-65 MG per tablet Take 1 tablet by mouth every 6 (six) hours as needed.     No facility-administered medications prior to  visit.     No Known Allergies  Review of Systems  Constitutional: Negative for chills, fever and malaise/fatigue.  HENT: Negative for congestion and hearing loss.   Eyes: Negative for discharge.  Respiratory: Negative for cough, sputum production and shortness of breath.   Cardiovascular: Negative for chest pain, palpitations and leg swelling.  Gastrointestinal: Negative for abdominal pain, blood in stool, constipation, diarrhea, heartburn, nausea and vomiting.  Genitourinary: Negative for dysuria, frequency, hematuria and urgency.  Musculoskeletal: Negative for back pain, falls and myalgias.  Skin: Negative for rash.  Neurological: Negative for dizziness, sensory change, loss of consciousness, weakness and headaches.  Endo/Heme/Allergies: Negative for environmental allergies. Does not bruise/bleed easily.  Psychiatric/Behavioral: Negative for depression and suicidal ideas. The patient is not nervous/anxious and does not have insomnia.        Objective:    Physical Exam  Constitutional: She is oriented to person, place, and time. She appears well-developed and well-nourished. No distress.  HENT:  Head: Normocephalic and atraumatic.  Right Ear: External ear normal.  Left Ear: External ear normal.  Nose: Nose normal.  Mouth/Throat: Oropharynx is clear and moist.  Eyes: Conjunctivae and EOM are normal. Pupils are equal, round, and reactive to light. Right eye exhibits no discharge. Left eye exhibits no  discharge.  Neck: Normal range of motion. Neck supple. No JVD present. No thyromegaly present.  Cardiovascular: Normal rate, regular rhythm, normal heart sounds and intact distal pulses.  No murmur heard. Pulmonary/Chest: Effort normal and breath sounds normal. No respiratory distress. She has no wheezes. She has no rales. She exhibits no tenderness.  Abdominal: Soft. Bowel sounds are normal. She exhibits no distension and no mass. There is no tenderness. There is no rebound and no  guarding.  Genitourinary: There is breast swelling. No breast tenderness, discharge or bleeding.  Musculoskeletal: Normal range of motion. She exhibits no edema or tenderness.  Lymphadenopathy:    She has no cervical adenopathy.  Neurological: She is alert and oriented to person, place, and time. She has normal reflexes. No cranial nerve deficit.  Skin: Skin is warm and dry. No rash noted. She is not diaphoretic. No erythema.  Psychiatric: She has a normal mood and affect. Her behavior is normal. Judgment and thought content normal.  Nursing note and vitals reviewed.   BP 122/62 (BP Location: Left Arm, Patient Position: Sitting, Cuff Size: Large)   Pulse 86   Temp 97.7 F (36.5 C) (Oral)   Resp 16   Ht 5' 3.39" (1.61 m)   Wt 162 lb 12.8 oz (73.8 kg)   SpO2 98%   BMI 28.49 kg/m  Wt Readings from Last 3 Encounters:  04/21/17 162 lb 12.8 oz (73.8 kg)  10/17/16 156 lb 12.8 oz (71.1 kg)  02/15/16 155 lb 6.4 oz (70.5 kg)   BP Readings from Last 3 Encounters:  04/21/17 122/62  10/17/16 116/64  02/15/16 125/66     Immunization History  Administered Date(s) Administered  . Influenza,inj,Quad PF,6+ Mos 11/02/2012, 11/10/2014  . Influenza-Unspecified 11/27/2015  . Tdap 09/08/2010    Health Maintenance  Topic Date Due  . HIV Screening  01/25/1969  . MAMMOGRAM  08/19/2017  . PAP SMEAR  11/09/2017  . TETANUS/TDAP  09/07/2020  . COLONOSCOPY  04/04/2026  . INFLUENZA VACCINE  Completed  . Hepatitis C Screening  Completed    Lab Results  Component Value Date   WBC 8.1 10/17/2016   HGB 14.2 10/17/2016   HCT 42.4 10/17/2016   PLT 274.0 10/17/2016   GLUCOSE 84 10/17/2016   CHOL 237 (H) 02/16/2016   TRIG 143.0 02/16/2016   HDL 61.80 02/16/2016   LDLDIRECT 164.5 11/21/2012   LDLCALC 147 (H) 02/16/2016   ALT 17 10/17/2016   AST 18 10/17/2016   NA 138 10/17/2016   K 4.1 10/17/2016   CL 101 10/17/2016   CREATININE 0.79 10/17/2016   BUN 17 10/17/2016   CO2 29 10/17/2016    TSH 3.36 02/16/2016   HGBA1C 5.7 02/16/2016   MICROALBUR 0.1 11/21/2012    Lab Results  Component Value Date   TSH 3.36 02/16/2016   Lab Results  Component Value Date   WBC 8.1 10/17/2016   HGB 14.2 10/17/2016   HCT 42.4 10/17/2016   MCV 89.6 10/17/2016   PLT 274.0 10/17/2016   Lab Results  Component Value Date   NA 138 10/17/2016   K 4.1 10/17/2016   CO2 29 10/17/2016   GLUCOSE 84 10/17/2016   BUN 17 10/17/2016   CREATININE 0.79 10/17/2016   BILITOT 0.3 10/17/2016   ALKPHOS 79 10/17/2016   AST 18 10/17/2016   ALT 17 10/17/2016   PROT 7.5 10/17/2016   ALBUMIN 4.4 10/17/2016   CALCIUM 9.5 10/17/2016   GFR 78.19 10/17/2016   Lab Results  Component Value  Date   CHOL 237 (H) 02/16/2016   Lab Results  Component Value Date   HDL 61.80 02/16/2016   Lab Results  Component Value Date   LDLCALC 147 (H) 02/16/2016   Lab Results  Component Value Date   TRIG 143.0 02/16/2016   Lab Results  Component Value Date   CHOLHDL 4 02/16/2016   Lab Results  Component Value Date   HGBA1C 5.7 02/16/2016         Assessment & Plan:   Problem List Items Addressed This Visit      Unprioritized   Preventative health care - Primary    ghm utd Check labs D/w shingrix See AVS      Relevant Orders   CBC with Differential/Platelet   Comprehensive metabolic panel   Lipid panel   TSH    Other Visit Diagnoses    Need for shingles vaccine       Relevant Medications   Zoster Vaccine Adjuvanted [facility]) injection   Hyperglycemia       Relevant Orders   Hemoglobin A1c      I have changed [place] Zoster Vaccine Adjuvanted. I am also having her maintain her aspirin-acetaminophen-caffeine.  Meds ordered this encounter  Medications  . DISCONTD: Zoster Vaccine Adjuvanted [facility]) injection    Sig: Inject 0.5 mLs into the muscle once for 1 dose.    Dispense:  0.5 mL    Refill:  0  . Zoster Vaccine Adjuvanted [facility]) injection    Sig: Inject 0.5  mLs into the muscle once for 1 dose. Repeat in 2-6 months    Dispense:  0.5 mL    Refill:  1    CMA served as scribe during this visit. History, Physical and Plan performed by medical provider. Documentation and orders reviewed and attested to.  Ann Held, DO

## 2017-04-22 LAB — COMPREHENSIVE METABOLIC PANEL
AG Ratio: 1.6 (calc) (ref 1.0–2.5)
ALT: 16 U/L (ref 6–29)
AST: 17 U/L (ref 10–35)
Albumin: 4.2 g/dL (ref 3.6–5.1)
Alkaline phosphatase (APISO): 82 U/L (ref 33–130)
BILIRUBIN TOTAL: 0.3 mg/dL (ref 0.2–1.2)
BUN: 19 mg/dL (ref 7–25)
CALCIUM: 9.4 mg/dL (ref 8.6–10.4)
CO2: 29 mmol/L (ref 20–32)
Chloride: 105 mmol/L (ref 98–110)
Creat: 0.78 mg/dL (ref 0.50–0.99)
Globulin: 2.7 g/dL (calc) (ref 1.9–3.7)
Glucose, Bld: 89 mg/dL (ref 65–99)
Potassium: 4.4 mmol/L (ref 3.5–5.3)
Sodium: 141 mmol/L (ref 135–146)
TOTAL PROTEIN: 6.9 g/dL (ref 6.1–8.1)

## 2017-04-22 LAB — CBC WITH DIFFERENTIAL/PLATELET
BASOS ABS: 58 {cells}/uL (ref 0–200)
Basophils Relative: 0.8 %
EOS ABS: 346 {cells}/uL (ref 15–500)
EOS PCT: 4.8 %
HEMATOCRIT: 37.6 % (ref 35.0–45.0)
Hemoglobin: 13 g/dL (ref 11.7–15.5)
LYMPHS ABS: 2347 {cells}/uL (ref 850–3900)
MCH: 29.6 pg (ref 27.0–33.0)
MCHC: 34.6 g/dL (ref 32.0–36.0)
MCV: 85.6 fL (ref 80.0–100.0)
MPV: 11.8 fL (ref 7.5–12.5)
Monocytes Relative: 10.9 %
Neutro Abs: 3665 cells/uL (ref 1500–7800)
Neutrophils Relative %: 50.9 %
Platelets: 270 10*3/uL (ref 140–400)
RBC: 4.39 10*6/uL (ref 3.80–5.10)
RDW: 12.3 % (ref 11.0–15.0)
Total Lymphocyte: 32.6 %
WBC mixed population: 785 cells/uL (ref 200–950)
WBC: 7.2 10*3/uL (ref 3.8–10.8)

## 2017-04-22 LAB — HEMOGLOBIN A1C
Hgb A1c MFr Bld: 5.6 % of total Hgb (ref <5.7)
MEAN PLASMA GLUCOSE: 114 (calc)
eAG (mmol/L): 6.3 (calc)

## 2017-04-22 LAB — LIPID PANEL
CHOLESTEROL: 214 mg/dL — AB (ref <200)
HDL: 53 mg/dL (ref >50)
LDL CHOLESTEROL (CALC): 128 mg/dL — AB
Non-HDL Cholesterol (Calc): 161 mg/dL (calc) — ABNORMAL HIGH (ref <130)
TRIGLYCERIDES: 189 mg/dL — AB (ref <150)
Total CHOL/HDL Ratio: 4 (calc) (ref <5.0)

## 2017-04-22 LAB — TSH: TSH: 3.12 mIU/L (ref 0.40–4.50)

## 2017-04-27 ENCOUNTER — Other Ambulatory Visit: Payer: Self-pay | Admitting: *Deleted

## 2017-04-27 DIAGNOSIS — E785 Hyperlipidemia, unspecified: Secondary | ICD-10-CM

## 2017-05-08 ENCOUNTER — Ambulatory Visit (HOSPITAL_BASED_OUTPATIENT_CLINIC_OR_DEPARTMENT_OTHER)
Admission: RE | Admit: 2017-05-08 | Discharge: 2017-05-08 | Disposition: A | Discharge status: Home / Self Care | Payer: No Typology Code available for payment source | Source: Ambulatory Visit | Attending: Family Medicine | Admitting: Family Medicine

## 2017-05-08 ENCOUNTER — Encounter: Payer: Self-pay | Admitting: Family Medicine

## 2017-05-08 ENCOUNTER — Ambulatory Visit (INDEPENDENT_AMBULATORY_CARE_PROVIDER_SITE_OTHER): Payer: No Typology Code available for payment source | Admitting: Family Medicine

## 2017-05-08 VITALS — BP 120/68 | HR 78 | Temp 97.6°F | Ht 63.5 in | Wt 163.2 lb

## 2017-05-08 DIAGNOSIS — X58XXXA Exposure to other specified factors, initial encounter: Secondary | ICD-10-CM | POA: Insufficient documentation

## 2017-05-08 DIAGNOSIS — S62114A Nondisplaced fracture of triquetrum [cuneiform] bone, right wrist, initial encounter for closed fracture: Secondary | ICD-10-CM | POA: Diagnosis not present

## 2017-05-08 DIAGNOSIS — M25531 Pain in right wrist: Secondary | ICD-10-CM | POA: Diagnosis present

## 2017-05-08 DIAGNOSIS — S62111A Displaced fracture of triquetrum [cuneiform] bone, right wrist, initial encounter for closed fracture: Secondary | ICD-10-CM | POA: Insufficient documentation

## 2017-05-08 MED ORDER — HYDROCODONE-ACETAMINOPHEN 5-325 MG PO TABS: 1.0000 | ORAL_TABLET | Freq: Four times a day (QID) | ORAL | 0 refills | Status: DC | PRN

## 2017-05-08 NOTE — Patient Instructions (Signed)
Ice/cold pack over area for 10-15 min twice daily.  OK to take Tylenol 1000 mg (2 extra strength tabs) or 975 mg (3 regular strength tabs) every 6 hours as needed.  Ibuprofen 400-600 mg (2-3 over the counter strength tabs) every 6 hours as needed for pain.  If you do not hear anything about your referral in the next couple days, call our office and ask for an update.  Let us know if you need anything.

## 2017-05-08 NOTE — Progress Notes (Signed)
Pre visit review using our clinic review tool, if applicable. No additional management support is needed unless otherwise documented below in the visit note. 

## 2017-05-08 NOTE — Progress Notes (Signed)
Musculoskeletal Exam  Patient: Olivia [name] DOB: 06-Jun-1955  DOS: 05/08/2017  SUBJECTIVE:  Chief Complaint:   Chief Complaint  Patient presents with  . Wrist Pain    right wrist pain, fell on sunday    Olivia [name] is a 62 y.o.  female for evaluation and treatment of R wrist pain.   Onset:  1 day ago.  Fell onto it.  Location: R dorsal wrist Character:  sharp, shooting and throbbing  Progression of issue:  A little worse today Associated symptoms: swelling, decreased ROM Treatment: to date has been OTC NSAIDS.   Neurovascular symptoms: no  ROS: Musculoskeletal/Extremities: +R wrist pain  No known health problems.  Objective: VITAL SIGNS: BP 120/68 (BP Location: Left Arm, Patient Position: Sitting, Cuff Size: Normal)   Pulse 78   Temp 97.6 F (36.4 C) (Oral)   Ht 5' 3.5" (1.613 m)   Wt 163 lb 4 oz (74 kg)   SpO2 97%   BMI 28.46 kg/m  Constitutional: Well formed, well developed. No acute distress. Cardiovascular: Brisk cap refill Thorax & Lungs: No accessory muscle use Musculoskeletal: R wrist.   Normal active range of motion: no.   Normal passive range of motion: no Tenderness to palpation: yes over dorsal carpal row Deformity: no Ecchymosis: no Neurologic: Normal sensory function Psychiatric: Normal mood. Age appropriate judgment and insight. Alert & oriented x 3.    Assessment:  Closed nondisplaced fracture of triquetrum of right wrist, initial encounter - Plan: DG Wrist Complete Right, Ambulatory referral to Sports Medicine, HYDROcodone-acetaminophen (NORCO/VICODIN) 5-325 MG tablet  Plan: X-ray does show a mildly displaced fracture of the triquetrum.  She is Olivia [name] in a volar wrist splint, we will have her continue this over the next 3-5 days. Warning s/s's for narcotics given as we called in [place] for breakthrough pain. She does not plan on picking it up, and instead using Tylenol/NSAIDs. Ice also encouraged. Refer to sports med for continued  management. F/u w pcp prn. The patient voiced understanding and agreement to the plan.   Lyman, DO 05/08/17  11:37 AM

## 2017-05-11 ENCOUNTER — Encounter: Payer: Self-pay | Admitting: Family Medicine

## 2017-05-11 ENCOUNTER — Ambulatory Visit: Payer: No Typology Code available for payment source | Admitting: Family Medicine

## 2017-05-11 DIAGNOSIS — S6991XA Unspecified injury of right wrist, hand and finger(s), initial encounter: Secondary | ICD-10-CM

## 2017-05-11 NOTE — Patient Instructions (Signed)
You have a dorsal avulsion of your triquetrum. These heal extremely well with conservative treatment. Wear the wrist brace for 4-6 weeks total. Ok to take this off to ice this 15 minutes at a time 3-4 times a day and to wash the area. Aleve, tylenol if needed for pain. Follow up with me the Wednesday before you go on your trip. Call me if you have any questions in the meantime.

## 2017-05-14 ENCOUNTER — Encounter: Payer: Self-pay | Admitting: Family Medicine

## 2017-05-14 DIAGNOSIS — S6991XD Unspecified injury of right wrist, hand and finger(s), subsequent encounter: Secondary | ICD-10-CM | POA: Insufficient documentation

## 2017-05-14 NOTE — Assessment & Plan Note (Signed)
independently reviewed radiographs and noted small dorsal avulsion of triquetrum.  Advised these do very well with conservative treatment with wrist brace for 4-6 weeks.  Ok to remove to ice and wash.  Aleve, tylenol if needed.  F/u in 3-4 weeks.

## 2017-05-14 NOTE — Progress Notes (Signed)
PCP: Ann Held, DO Consultation requested by: Riki Sheer MD  Subjective:   HPI: Patient is a 62 y.o. female here for right wrist injury.  Patient reports on 3/31 she was walking her dogs when she tumbled and landed awkwardly onto her right wrist. Felt ok initially but later that night developed soreness dorsal right wrist. Difficulty twisting at wrist. Pain level 1/10 now and still a soreness. Has been icing, elevating, taking aleve initially. No skin changes, numbness. Right handed.  History reviewed. No pertinent past medical history.  Current Outpatient Medications on File Prior to Visit  Medication Sig Dispense Refill  . aspirin-acetaminophen-caffeine (EXCEDRIN MIGRAINE) 250-250-65 MG per tablet Take 1 tablet by mouth every 6 (six) hours as needed.    Marland Kitchen HYDROcodone-acetaminophen (NORCO/VICODIN) 5-325 MG tablet Take 1 tablet by mouth every 6 (six) hours as needed for moderate pain. 10 tablet 0   No current facility-administered medications on file prior to visit.     History reviewed. No pertinent surgical history.  No Known Allergies  Social History   Socioeconomic History  . Marital status: Married    Spouse name: Not on file  . Number of children: 2  . Years of education: Not on file  . Highest education level: Not on file  Occupational History  . Occupation: Chartered certified accountant: [employer]  . Financial resource strain: Not on file  . Food insecurity:    Worry: Not on file    Inability: Not on file  . Transportation needs:    Medical: Not on file    Non-medical: Not on file  Tobacco Use  . Smoking status: Never Smoker  . Smokeless tobacco: Never Used  Substance and Sexual Activity  . Alcohol use: Yes    Alcohol/week: 1.0 oz    Types: 2 Standard drinks or equivalent per week  . Drug use: No  . Sexual activity: Yes    Partners: Male  Lifestyle  . Physical activity:    Days per week: Not on file    Minutes per  session: Not on file  . Stress: Not on file  Relationships  . Social connections:    Talks on phone: Not on file    Gets together: Not on file    Attends religious service: Not on file    Active member of club or organization: Not on file    Attends meetings of clubs or organizations: Not on file    Relationship status: Not on file  . Intimate partner violence:    Fear of current or ex partner: Not on file    Emotionally abused: Not on file    Physically abused: Not on file    Forced sexual activity: Not on file  Other Topics Concern  . Not on file  Social History Narrative   Exercise---  3-4 x a week    Family History  Problem Relation Age of Onset  . Arthritis Mother   . Dementia Mother   . Heart disease Father   . Stroke Father   . Hypertension Father   . Diabetes Father   . Cancer Father        liver    BP 108/71   Pulse 91   Ht 5\' 4"  (1.626 m)   Wt 162 lb (73.5 kg)   BMI 27.81 kg/m   Review of Systems: See HPI above.     Objective:  Physical Exam:  Gen: NAD, comfortable in exam room  Right wrist: Mild dorsal swelling, no bruising.  No other deformity. TTP dorsally proximal carpal row medially.  No snuffbox, other tenderness of wrist or hand. Full flexion but mild limitation extension.  Strength 5/5 finger abduction, extension, thumb opposition. NVI distally.  Left wrist: No deformity. FROM with 5/5 strength. No tenderness to palpation. NVI distally.   Assessment & Plan:  1. Right wrist injury - independently reviewed radiographs and noted small dorsal avulsion of triquetrum.  Advised these do very well with conservative treatment with wrist brace for 4-6 weeks.  Ok to remove to ice and wash.  Aleve, tylenol if needed.  F/u in 3-4 weeks.

## 2017-05-31 ENCOUNTER — Ambulatory Visit: Payer: No Typology Code available for payment source | Admitting: Family Medicine

## 2017-05-31 ENCOUNTER — Encounter: Payer: Self-pay | Admitting: Family Medicine

## 2017-05-31 DIAGNOSIS — S6991XD Unspecified injury of right wrist, hand and finger(s), subsequent encounter: Secondary | ICD-10-CM

## 2017-06-04 ENCOUNTER — Encounter: Payer: Self-pay | Admitting: Family Medicine

## 2017-06-04 NOTE — Assessment & Plan Note (Signed)
2/2 small dorsal avulsion of triquetrum.  Clinically improved about 4 weeks out from injury.  Wrist brace as needed now.  Icing, tylenol, motrin also if needed.  F/u prn.

## 2017-06-04 NOTE — Progress Notes (Signed)
PCP: Ann Held, DO Consultation requested by: Riki Sheer MD  Subjective:   HPI: Patient is a 62 y.o. female here for right wrist injury.  4/4: Patient reports on 3/31 she was walking her dogs when she tumbled and landed awkwardly onto her right wrist. Felt ok initially but later that night developed soreness dorsal right wrist. Difficulty twisting at wrist. Pain level 1/10 now and still a soreness. Has been icing, elevating, taking aleve initially. No skin changes, numbness. Right handed.  4/24: Patient reports she's doing better compared to last visit. Pain is minimal and only feels up to 0.5/10 with lifting heavy items. Wearing brace regularly. Not needing any medicine. No skin changes.  History reviewed. No pertinent past medical history.  Current Outpatient Medications on File Prior to Visit  Medication Sig Dispense Refill  . aspirin-acetaminophen-caffeine (EXCEDRIN MIGRAINE) 250-250-65 MG per tablet Take 1 tablet by mouth every 6 (six) hours as needed.    Marland Kitchen HYDROcodone-acetaminophen (NORCO/VICODIN) 5-325 MG tablet Take 1 tablet by mouth every 6 (six) hours as needed for moderate pain. 10 tablet 0   No current facility-administered medications on file prior to visit.     History reviewed. No pertinent surgical history.  No Known Allergies  Social History   Socioeconomic History  . Marital status: Married    Spouse name: Not on file  . Number of children: 2  . Years of education: Not on file  . Highest education level: Not on file  Occupational History  . Occupation: Chartered certified accountant: [employer]  . Financial resource strain: Not on file  . Food insecurity:    Worry: Not on file    Inability: Not on file  . Transportation needs:    Medical: Not on file    Non-medical: Not on file  Tobacco Use  . Smoking status: Never Smoker  . Smokeless tobacco: Never Used  Substance and Sexual Activity  . Alcohol use: Yes   Alcohol/week: 1.0 oz    Types: 2 Standard drinks or equivalent per week  . Drug use: No  . Sexual activity: Yes    Partners: Male  Lifestyle  . Physical activity:    Days per week: Not on file    Minutes per session: Not on file  . Stress: Not on file  Relationships  . Social connections:    Talks on phone: Not on file    Gets together: Not on file    Attends religious service: Not on file    Active member of club or organization: Not on file    Attends meetings of clubs or organizations: Not on file    Relationship status: Not on file  . Intimate partner violence:    Fear of current or ex partner: Not on file    Emotionally abused: Not on file    Physically abused: Not on file    Forced sexual activity: Not on file  Other Topics Concern  . Not on file  Social History Narrative   Exercise---  3-4 x a week    Family History  Problem Relation Age of Onset  . Arthritis Mother   . Dementia Mother   . Heart disease Father   . Stroke Father   . Hypertension Father   . Diabetes Father   . Cancer Father        liver    BP 136/76   Ht 5\' 4"  (1.626 m)   Wt 162 lb (73.5  kg)   BMI 27.81 kg/m   Review of Systems: See HPI above.     Objective:  Physical Exam:  Gen: NAD, comfortable in exam room  Right wrist: No deformity, swelling, bruising. No TTP including dorsal proximal carpal row. FROM digits and wrist with 5/5 strength. NVI distally.   Assessment & Plan:  1. Right wrist injury - 2/2 small dorsal avulsion of triquetrum.  Clinically improved about 4 weeks out from injury.  Wrist brace as needed now.  Icing, tylenol, motrin also if needed.  F/u prn.

## 2017-10-30 MED FILL — SHINGRIX 50 MCG SUS: 50 | 1 days supply | Qty: 1 | Fill #0

## 2017-11-08 ENCOUNTER — Encounter: Payer: Self-pay | Admitting: *Deleted

## 2018-01-10 MED FILL — SHINGRIX 50 MCG SUS: 50 | 1 days supply | Qty: 1 | Fill #1

## 2018-03-01 MED FILL — SHINGRIX 50 MCG SUS: 50 | 1 days supply | Qty: 1 | Fill #1

## 2018-03-28 ENCOUNTER — Encounter: Payer: Self-pay | Admitting: Family Medicine

## 2018-03-28 DIAGNOSIS — M25511 Pain in right shoulder: Secondary | ICD-10-CM

## 2018-03-28 DIAGNOSIS — M79673 Pain in unspecified foot: Secondary | ICD-10-CM

## 2018-04-21 NOTE — Progress Notes (Signed)
[name] [place] [place], [place] [address] Phone: [phone] Subjective:   I Olivia [name] am serving as a Education administrator for Dr. Hulan Saas.  I'm seeing this patient by the request  of:  Ann Held, DO   CC: Shoulder and foot pain  DJM:[id number]  Olivia [name] is a 63 y.o. female coming in with complaint of right shoulder and bilateral foot pain. Kayak. Certain movements with her paddle makes the pain worse. Toes sometimes cramp. Can't walk bare foot. Pain with first steps in the morning.   Onset- Chronic  Location- balls of her feet Duration-  Character-  Aggravating factors- flexion, sitting makes the balls of her feet worse Reliving factors-  Therapies tried- aleve  Severity-4 out of 10 but does not seem to be improving.  Regarding her right shoulder seems to be worse when she lays on it at night.  Sometimes a certain movements are doing okay and then sometimes worse.  Was started with a lot of push-ups.     No past medical history on file. No past surgical history on file. Social History   Socioeconomic History  . Marital status: Married    Spouse name: Not on file  . Number of children: 2  . Years of education: Not on file  . Highest education level: Not on file  Occupational History  . Occupation: Chartered certified accountant: [employer]  . Financial resource strain: Not on file  . Food insecurity:    Worry: Not on file    Inability: Not on file  . Transportation needs:    Medical: Not on file    Non-medical: Not on file  Tobacco Use  . Smoking status: Never Smoker  . Smokeless tobacco: Never Used  Substance and Sexual Activity  . Alcohol use: Yes    Alcohol/week: 2.0 standard drinks    Types: 2 Standard drinks or equivalent per week  . Drug use: No  . Sexual activity: Yes    Partners: Male  Lifestyle  . Physical activity:    Days per week: Not on file    Minutes per session: Not on file   . Stress: Not on file  Relationships  . Social connections:    Talks on phone: Not on file    Gets together: Not on file    Attends religious service: Not on file    Active member of club or organization: Not on file    Attends meetings of clubs or organizations: Not on file    Relationship status: Not on file  Other Topics Concern  . Not on file  Social History Narrative   Exercise---  3-4 x a week   No Known Allergies Family History  Problem Relation Age of Onset  . Arthritis Mother   . Dementia Mother   . Heart disease Father   . Stroke Father   . Hypertension Father   . Diabetes Father   . Cancer Father        liver       Current Outpatient Medications (Analgesics):  .  aspirin-acetaminophen-caffeine (EXCEDRIN MIGRAINE) 229-798-92 MG per tablet, Take 1 tablet by mouth every 6 (six) hours as needed.      Past medical history, social, surgical and family history all reviewed in electronic medical record.  No pertanent information unless stated regarding to the chief complaint.   Review of Systems:  No headache, visual changes, nausea, vomiting, diarrhea,  constipation, dizziness, abdominal pain, skin rash, fevers, chills, night sweats, weight loss, swollen lymph nodes, body aches, joint swelling, , chest pain, shortness of breath, mood changes.  Positive muscle aches  Objective  Blood pressure 90/62, pulse 89, height 5\' 3"  (1.6 m), weight 163 lb (73.9 kg), SpO2 98 %.    General: No apparent distress alert and oriented x3 mood and affect normal, dressed appropriately.  HEENT: Pupils equal, extraocular movements intact  Respiratory: Patient's speak in full sentences and does not appear short of breath  Cardiovascular: No lower extremity edema, non tender, no erythema  Skin: Warm dry intact with no signs of infection or rash on extremities or on axial skeleton.  Abdomen: Soft nontender  Neuro: Cranial nerves II through XII are intact, neurovascularly intact in all  extremities with 2+ DTRs and 2+ pulses.  Lymph: No lymphadenopathy of posterior or anterior cervical chain or axillae bilaterally.  Gait normal with good balance and coordination.  MSK:  Non tender with full range of motion and good stability and symmetric strength and tone of elbows, wrist, hip, knee and ankles bilaterally.  Shoulder: Right Inspection reveals no abnormalities, atrophy or asymmetry. Palpation mild pain over the acromioclavicular joint ROM is full in all planes. Rotator cuff strength normal throughout. Impingement noted Speeds and Yergason's tests normal. No labral pathology noted with negative Obrien's, negative clunk and good stability.  Positive crossover Normal scapular function observed. No painful arc and no drop arm sign. No apprehension sign Contralateral shoulder unremarkable  Foot exam shows intact of the transverse arch bilaterally.  Mild callus formation between the second and fourth toes on the right foot on the plantar aspect.  No pain with compression.  Longitudinal arch still intact  MSK US performed of: right  This study was ordered, performed, and interpreted by Charlann Boxer D.O.  Shoulder:   Supraspinatus:  Appears normal on long and transverse views, no bursal bulge seen with shoulder abduction on impingement view. Infraspinatus:  Appears normal on long and transverse views. Subscapularis:  Appears normal on long and transverse views. Teres Minor:  Appears normal on long and transverse views. AC joint: Capsular distention with moderate arthritic changes Glenohumeral Joint:  Appears normal without effusion. Glenoid Labrum:  Intact without visualized tears. Biceps Tendon:  Appears normal on long and transverse views, no fraying of tendon, tendon located in intertubercular groove, no subluxation with shoulder internal or external rotation. No increased power doppler signal. Impression: Acromioclavicular arthritis mild  [id number]; 15 additional minutes  spent for Therapeutic exercises as stated in above notes.  This included exercises focusing on stretching, strengthening, with significant focus on eccentric aspects.   Long term goals include an improvement in range of motion, strength, endurance as well as avoiding reinjury. Patient's frequency would include in 1-2 times a day, 3-5 times a week for a duration of 6-12 weeks. Shoulder Exercises that included:  Basic scapular stabilization to include adduction and depression of scapula Scaption, focusing on proper movement and good control Internal and External rotation utilizing a theraband, with elbow tucked at side entire time Rows with theraband which was given    Proper technique shown and discussed handout in great detail with ATC.  All questions were discussed and answered.      Impression and Recommendations:     This case required medical decision making of moderate complexity. The above documentation has been reviewed and is accurate and complete Lyndal Pulley, DO       Note: This dictation was  prepared with Dragon dictation along with smaller phrase technology. Any transcriptional errors that result from this process are unintentional.

## 2018-04-23 ENCOUNTER — Other Ambulatory Visit (HOSPITAL_COMMUNITY)
Admission: RE | Admit: 2018-04-23 | Discharge: 2018-04-23 | Disposition: A | Discharge status: Home / Self Care | Payer: No Typology Code available for payment source | Source: Ambulatory Visit | Attending: Family Medicine | Admitting: Family Medicine

## 2018-04-23 ENCOUNTER — Ambulatory Visit: Payer: No Typology Code available for payment source | Admitting: Family Medicine

## 2018-04-23 ENCOUNTER — Other Ambulatory Visit: Payer: Self-pay

## 2018-04-23 ENCOUNTER — Ambulatory Visit (INDEPENDENT_AMBULATORY_CARE_PROVIDER_SITE_OTHER): Payer: No Typology Code available for payment source | Admitting: Family Medicine

## 2018-04-23 ENCOUNTER — Encounter: Payer: Self-pay | Admitting: Family Medicine

## 2018-04-23 ENCOUNTER — Ambulatory Visit: Payer: Self-pay

## 2018-04-23 VITALS — BP 110/70 | HR 76 | Temp 98.1°F | Resp 12 | Ht 63.5 in | Wt 163.2 lb

## 2018-04-23 VITALS — BP 90/62 | HR 89 | Ht 63.0 in | Wt 163.0 lb

## 2018-04-23 DIAGNOSIS — Z0000 Encounter for general adult medical examination without abnormal findings: Secondary | ICD-10-CM | POA: Diagnosis not present

## 2018-04-23 DIAGNOSIS — H6502 Acute serous otitis media, left ear: Secondary | ICD-10-CM | POA: Diagnosis not present

## 2018-04-23 DIAGNOSIS — M216X9 Other acquired deformities of unspecified foot: Secondary | ICD-10-CM | POA: Diagnosis not present

## 2018-04-23 DIAGNOSIS — M25511 Pain in right shoulder: Principal | ICD-10-CM

## 2018-04-23 DIAGNOSIS — M19019 Primary osteoarthritis, unspecified shoulder: Secondary | ICD-10-CM | POA: Insufficient documentation

## 2018-04-23 DIAGNOSIS — G8929 Other chronic pain: Secondary | ICD-10-CM

## 2018-04-23 DIAGNOSIS — M19011 Primary osteoarthritis, right shoulder: Secondary | ICD-10-CM | POA: Diagnosis not present

## 2018-04-23 LAB — CBC WITH DIFFERENTIAL/PLATELET
BASOS ABS: 0.1 10*3/uL (ref 0.0–0.1)
Basophils Relative: 1 % (ref 0.0–3.0)
Eosinophils Absolute: 0.3 10*3/uL (ref 0.0–0.7)
Eosinophils Relative: 4.5 % (ref 0.0–5.0)
HCT: 42.5 % (ref 36.0–46.0)
Hemoglobin: 14.4 g/dL (ref 12.0–15.0)
Lymphocytes Relative: 32.8 % (ref 12.0–46.0)
Lymphs Abs: 2.4 10*3/uL (ref 0.7–4.0)
MCHC: 34 g/dL (ref 30.0–36.0)
MCV: 87.8 fl (ref 78.0–100.0)
MONOS PCT: 9.5 % (ref 3.0–12.0)
Monocytes Absolute: 0.7 10*3/uL (ref 0.1–1.0)
NEUTROS ABS: 3.8 10*3/uL (ref 1.4–7.7)
Neutrophils Relative %: 52.2 % (ref 43.0–77.0)
Platelets: 282 10*3/uL (ref 150.0–400.0)
RBC: 4.84 Mil/uL (ref 3.87–5.11)
RDW: 13.1 % (ref 11.5–15.5)
WBC: 7.3 10*3/uL (ref 4.0–10.5)

## 2018-04-23 LAB — COMPREHENSIVE METABOLIC PANEL
ALBUMIN: 4.6 g/dL (ref 3.5–5.2)
ALT: 15 U/L (ref 0–35)
AST: 16 U/L (ref 0–37)
Alkaline Phosphatase: 95 U/L (ref 39–117)
BILIRUBIN TOTAL: 0.5 mg/dL (ref 0.2–1.2)
BUN: 13 mg/dL (ref 6–23)
CO2: 31 mEq/L (ref 19–32)
Calcium: 9.7 mg/dL (ref 8.4–10.5)
Chloride: 98 mEq/L (ref 96–112)
Creatinine, Ser: 0.8 mg/dL (ref 0.40–1.20)
GFR: 72.16 mL/min (ref >60.00)
Glucose, Bld: 79 mg/dL (ref 70–99)
Potassium: 4.2 mEq/L (ref 3.5–5.1)
Sodium: 137 mEq/L (ref 135–145)
TOTAL PROTEIN: 7.6 g/dL (ref 6.0–8.3)

## 2018-04-23 LAB — LIPID PANEL
CHOL/HDL RATIO: 4
Cholesterol: 254 mg/dL — ABNORMAL HIGH (ref 0–200)
HDL: 57.1 mg/dL (ref >39.00)
LDL Cholesterol: 165 mg/dL — ABNORMAL HIGH (ref 0–99)
NonHDL: 197.09
TRIGLYCERIDES: 158 mg/dL — AB (ref 0.0–149.0)
VLDL: 31.6 mg/dL (ref 0.0–40.0)

## 2018-04-23 LAB — TSH: TSH: 2.98 u[IU]/mL (ref 0.35–4.50)

## 2018-04-23 LAB — HEMOGLOBIN A1C: Hgb A1c MFr Bld: 5.9 % (ref 4.6–6.5)

## 2018-04-23 MED ORDER — FLUTICASONE PROPIONATE 50 MCG/ACT NA SUSP: 2.0000 | Freq: Every day | NASAL | 6 refills | Status: DC

## 2018-04-23 NOTE — Patient Instructions (Signed)
Good to see you  Ice is your friend Ice 20 minutes 2 times daily. Usually after activity and before bed. pennsaid pinkie amount topically 2 times daily as needed.  Good shoes with rigid bottom.  Olivia [name], [place] or New balance greater then 700 Spenco orthotics "total support" online would be great  [facility] joint arthritis of the shoulder Keep hands within peripheral vision  See me again in 6 weeks if not perfect

## 2018-04-23 NOTE — Assessment & Plan Note (Signed)
Research bilaterally breakdown.  Discussed over-the-counter orthotics and proper shoes.  We discussed exercises and work with Product/process development scientist.  If worsening pain will consider custom orthotics.  Follow-up again in 6 weeks

## 2018-04-23 NOTE — Assessment & Plan Note (Signed)
Right-sided.  Home exercises given, discussed icing regimen and home exercises.  Discussed topical anti-inflammatories and given trial.  Worsening pain will consider injection and formal physical therapy.

## 2018-04-23 NOTE — Assessment & Plan Note (Signed)
flonase and antihistamine  ?rto prn  ?

## 2018-04-23 NOTE — Patient Instructions (Signed)
Preventive Care 40-64 Years, Female Preventive care refers to lifestyle choices and visits with your health care provider that can promote health and wellness. What does preventive care include?   A yearly physical exam. This is also called an annual well check.  Dental exams once or twice a year.  Routine eye exams. Ask your health care provider how often you should have your eyes checked.  Personal lifestyle choices, including: ? Daily care of your teeth and gums. ? Regular physical activity. ? Eating a healthy diet. ? Avoiding tobacco and drug use. ? Limiting alcohol use. ? Practicing safe sex. ? Taking low-dose aspirin daily starting at age 50. ? Taking vitamin and mineral supplements as recommended by your health care provider. What happens during an annual well check? The services and screenings done by your health care provider during your annual well check will depend on your age, overall health, lifestyle risk factors, and family history of disease. Counseling Your health care provider may ask you questions about your:  Alcohol use.  Tobacco use.  Drug use.  Emotional well-being.  Home and relationship well-being.  Sexual activity.  Eating habits.  Work and work environment.  Method of birth control.  Menstrual cycle.  Pregnancy history. Screening You may have the following tests or measurements:  Height, weight, and BMI.  Blood pressure.  Lipid and cholesterol levels. These may be checked every 5 years, or more frequently if you are over 50 years old.  Skin check.  Lung cancer screening. You may have this screening every year starting at age 55 if you have a 30-pack-year history of smoking and currently smoke or have quit within the past 15 years.  Colorectal cancer screening. All adults should have this screening starting at age 50 and continuing until age 75. Your health care provider may recommend screening at age 45. You will have tests every  1-10 years, depending on your results and the type of screening test. People at increased risk should start screening at an earlier age. Screening tests may include: ? Guaiac-based fecal occult blood testing. ? Fecal immunochemical test (FIT). ? Stool DNA test. ? Virtual colonoscopy. ? Sigmoidoscopy. During this test, a flexible tube with a tiny camera (sigmoidoscope) is used to examine your rectum and lower colon. The sigmoidoscope is inserted through your anus into your rectum and lower colon. ? Colonoscopy. During this test, a long, thin, flexible tube with a tiny camera (colonoscope) is used to examine your entire colon and rectum.  Hepatitis C blood test.  Hepatitis B blood test.  Sexually transmitted disease (STD) testing.  Diabetes screening. This is done by checking your blood sugar (glucose) after you have not eaten for a while (fasting). You may have this done every 1-3 years.  Mammogram. This may be done every 1-2 years. Talk to your health care provider about when you should start having regular mammograms. This may depend on whether you have a family history of breast cancer.  BRCA-related cancer screening. This may be done if you have a family history of breast, ovarian, tubal, or peritoneal cancers.  Pelvic exam and Pap test. This may be done every 3 years starting at age 21. Starting at age 30, this may be done every 5 years if you have a Pap test in combination with an HPV test.  Bone density scan. This is done to screen for osteoporosis. You may have this scan if you are at high risk for osteoporosis. Discuss your test results, treatment options,   and if necessary, the need for more tests with your health care provider. Vaccines Your health care provider may recommend certain vaccines, such as:  Influenza vaccine. This is recommended every year.  Tetanus, diphtheria, and acellular pertussis (Tdap, Td) vaccine. You may need a Td booster every 10 years.  Varicella  vaccine. You may need this if you have not been vaccinated.  Zoster vaccine. You may need this after age 38.  Measles, mumps, and rubella (MMR) vaccine. You may need at least one dose of MMR if you were born in 1957 or later. You may also need a second dose.  Pneumococcal 13-valent conjugate (PCV13) vaccine. You may need this if you have certain conditions and were not previously vaccinated.  Pneumococcal polysaccharide (PPSV23) vaccine. You may need one or two doses if you smoke cigarettes or if you have certain conditions.  Meningococcal vaccine. You may need this if you have certain conditions.  Hepatitis A vaccine. You may need this if you have certain conditions or if you travel or work in places where you may be exposed to hepatitis A.  Hepatitis B vaccine. You may need this if you have certain conditions or if you travel or work in places where you may be exposed to hepatitis B.  Haemophilus influenzae type b (Hib) vaccine. You may need this if you have certain conditions. Talk to your health care provider about which screenings and vaccines you need and how often you need them. This information is not intended to replace advice given to you by your health care provider. Make sure you discuss any questions you have with your health care provider. Document Released: 02/20/2015 Document Revised: 03/16/2017 Document Reviewed: 11/25/2014 Elsevier Interactive Patient Education  2019 [employer].

## 2018-04-23 NOTE — Progress Notes (Signed)
Subjective:     Olivia [name] is a 63 y.o. female and is here for a comprehensive physical exam. The patient reports problem with L ear -- its been draining and he feels slightly congested   This has been going on for a few weeks .  Social History   Socioeconomic History  . Marital status: Married    Spouse name: Not on file  . Number of children: 2  . Years of education: Not on file  . Highest education level: Not on file  Occupational History  . 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 Financial resource strain: Not on file  . Food insecurity:    Worry: Not on file    Inability: Not on file  . Transportation needs:    Medical: Not on file    Non-medical: Not on file  Tobacco Use  . Smoking status: Never Smoker  . Smokeless tobacco: Never Used  Substance and Sexual Activity  . Alcohol use: Yes    Alcohol/week: 2.0 standard drinks    Types: 2 Standard drinks or equivalent per week  . Drug use: No  . Sexual activity: Yes    Partners: Male  Lifestyle  . Physical activity:    Days per week: Not on file    Minutes per session: Not on file  . Stress: Not on file  Relationships  . Social connections:    Talks on phone: Not on file    Gets together: Not on file    Attends religious service: Not on file    Active member of club or organization: Not on file    Attends meetings of clubs or organizations: Not on file    Relationship status: Not on file  . Intimate partner violence:    Fear of current or ex partner: Not on file    Emotionally abused: Not on file    Physically abused: Not on file    Forced sexual activity: Not on file  Other Topics Concern  . Not on file  Social History Narrative   Exercise---  3-4 x a week   Health Maintenance  Topic Date Due  . HIV Screening  01/25/1971  . MAMMOGRAM  08/19/2017  . PAP SMEAR-Modifier  11/09/2017  . TETANUS/TDAP  09/07/2020  . COLONOSCOPY  04/04/2026  . INFLUENZA VACCINE  Completed  .  Hepatitis C Screening  Completed    The following portions of the patient's history were reviewed and updated as appropriate: She  has no past medical history on file. She does not have any pertinent problems on file. She  has no past surgical history on file. Her family history includes Arthritis in her mother; Cancer in her father; Dementia in her mother; Diabetes in her father; Heart disease in her father; Hypertension in her father; Stroke in her father. She  reports that she has never smoked. She has never used smokeless tobacco. She reports current alcohol use of about 2.0 standard drinks of alcohol per week. She reports that she does not use drugs. She has a current medication list which includes the following prescription(s): aspirin-acetaminophen-caffeine and fluticasone. Current Outpatient Medications on File Prior to Visit  Medication Sig Dispense Refill  . aspirin-acetaminophen-caffeine (EXCEDRIN MIGRAINE) 250-250-65 MG per tablet Take 1 tablet by mouth every 6 (six) hours as needed.     No current facility-administered medications on file prior to visit.    She has No Known Allergies..  Review of Systems Review  of Systems  Constitutional: Negative for activity change, appetite change and fatigue.  HENT: Negative for , congestion, tinnitus and + ear drainage  dentist q85m Eyes: Negative for visual disturbance (see optho q1y -- vision corrected to 20/20 with glasses).  Respiratory: Negative for cough, chest tightness and shortness of breath.   Cardiovascular: Negative for chest pain, palpitations and leg swelling.  Gastrointestinal: Negative for abdominal pain, diarrhea, constipation and abdominal distention.  Genitourinary: Negative for urgency, frequency, decreased urine volume and difficulty urinating.  Musculoskeletal: + back pain  Skin: Negative for color change, pallor and rash.  Neurological: Negative for dizziness, light-headedness, numbness and headaches.   Hematological: Negative for adenopathy. Does not bruise/bleed easily.  Psychiatric/Behavioral: Negative for suicidal ideas, confusion, sleep disturbance, self-injury, dysphoric mood, decreased concentration and agitation.       Objective:    BP 110/70 (BP Location: Right Arm, Cuff Size: Normal)   Pulse 76   Temp 98.1 F (36.7 C) (Oral)   Resp 12   Ht 5' 3.5" (1.613 m)   Wt 163 lb 3.2 oz (74 kg)   SpO2 98%   BMI 28.46 kg/m  General appearance: alert, cooperative, appears stated age and no distress Head: Normocephalic, without obvious abnormality, atraumatic Eyes: conjunctivae/corneas clear. PERRL, EOM's intact. Fundi benign. Ears: L + fluid , orange- Nose: Nares normal. Septum midline. Mucosa normal. No drainage or sinus tenderness. Throat: lips, mucosa, and tongue normal; teeth and gums normal Neck: no adenopathy, no carotid bruit, no JVD, supple, symmetrical, trachea midline and thyroid not enlarged, symmetric, no tenderness/mass/nodules Back: symmetric, no curvature. ROM normal. No CVA tenderness. Lungs: clear to auscultation bilaterally Breasts: normal appearance, no masses or tenderness Heart: regular rate and rhythm, S1, S2 normal, no murmur, click, rub or gallop Abdomen: soft, non-tender; bowel sounds normal; no masses,  no organomegaly Pelvic: cervix normal in appearance, external genitalia normal, no adnexal masses or tenderness, no cervical motion tenderness, rectovaginal septum normal, uterus normal size, shape, and consistency, vagina normal without discharge and pap done, rectal heme neg brown stool  Extremities: extremities normal, atraumatic, no cyanosis or edema Pulses: 2+ and symmetric Skin: Skin color, texture, turgor normal. No rashes or lesions Lymph nodes: Cervical, supraclavicular, and axillary nodes normal. Neurologic: Alert and oriented X 3, normal strength and tone. Normal symmetric reflexes. Normal coordination and gait    Assessment:    Healthy  female exam.      Plan:    ghm utd Check labs See After Visit Summary for Counseling Recommendations    1. Preventative health care See above  - Lipid panel - Hemoglobin A1c - CBC with Differential/Platelet - Comprehensive metabolic panel - TSH - Cytology - PAP( [facility])  2. Non-recurrent acute serous otitis media of left ear flonase and antihistamine

## 2018-04-24 LAB — CYTOLOGY - PAP
Diagnosis: NEGATIVE
HPV: NOT DETECTED

## 2018-04-27 ENCOUNTER — Other Ambulatory Visit: Payer: Self-pay | Admitting: Family Medicine

## 2018-04-27 DIAGNOSIS — E7849 Other hyperlipidemia: Secondary | ICD-10-CM

## 2018-06-11 ENCOUNTER — Ambulatory Visit: Payer: No Typology Code available for payment source | Admitting: Family Medicine

## 2018-10-01 ENCOUNTER — Ambulatory Visit (INDEPENDENT_AMBULATORY_CARE_PROVIDER_SITE_OTHER): Payer: No Typology Code available for payment source | Admitting: Family Medicine

## 2018-10-01 ENCOUNTER — Encounter: Payer: Self-pay | Admitting: Family Medicine

## 2018-10-01 ENCOUNTER — Other Ambulatory Visit: Payer: Self-pay

## 2018-10-01 VITALS — BP 118/70 | HR 86 | Temp 97.3°F | Resp 18 | Ht 63.0 in | Wt 160.2 lb

## 2018-10-01 DIAGNOSIS — M25511 Pain in right shoulder: Secondary | ICD-10-CM | POA: Diagnosis not present

## 2018-10-01 MED ORDER — PREDNISONE 10 MG PO TABS: ORAL_TABLET | ORAL | 0 refills | Status: DC

## 2018-10-01 NOTE — Progress Notes (Addendum)
++ Patient ID: Olivia [name], female    DOB: [birth year]  Age: 63 y.o. MRN: [id number]    Subjective:  Subjective  HPI Olivia [name] presents for R shoulder pain--- her and her husband Olivia [name] over the weekend and this pain started yesterday.   Yesterday she did laundry and went to grocery store.  She took an aleve  Review of Systems  Constitutional: Negative for appetite change, diaphoresis, fatigue and unexpected weight change.  Eyes: Negative for pain, redness and visual disturbance.  Respiratory: Negative for cough, chest tightness, shortness of breath and wheezing.   Cardiovascular: Negative for chest pain, palpitations and leg swelling.  Endocrine: Negative for cold intolerance, heat intolerance, polydipsia, polyphagia and polyuria.  Genitourinary: Negative for difficulty urinating, dysuria and frequency.  Musculoskeletal: Positive for arthralgias.       R shoulder pain--severe  Neurological: Negative for dizziness, light-headedness, numbness and headaches.    History No past medical history on file.  She has no past surgical history on file.   Her family history includes Arthritis in her mother; Cancer in her father; Dementia in her mother; Diabetes in her father; Heart disease in her father; Hypertension in her father; Stroke in her father.She reports that she has never smoked. She has never used smokeless tobacco. She reports current alcohol use of about 2.0 standard drinks of alcohol per week. She reports that she does not use drugs.  Current Outpatient Medications on File Prior to Visit  Medication Sig Dispense Refill  . aspirin-acetaminophen-caffeine (EXCEDRIN MIGRAINE) 250-250-65 MG per tablet Take 1 tablet by mouth every 6 (six) hours as needed.    . naproxen sodium (ALEVE) 220 MG tablet Take 220 mg by mouth.     No current facility-administered medications on file prior to visit.      Objective:  Objective  Physical Exam Vitals  signs and nursing note reviewed.  Constitutional:      Appearance: She is well-developed.  HENT:     Head: Normocephalic and atraumatic.  Eyes:     Conjunctiva/sclera: Conjunctivae normal.  Neck:     Musculoskeletal: Normal range of motion and neck supple.     Thyroid: No thyromegaly.     Vascular: No carotid bruit or JVD.  Cardiovascular:     Rate and Rhythm: Normal rate and regular rhythm.     Heart sounds: Normal heart sounds. No murmur.  Pulmonary:     Effort: Pulmonary effort is normal. No respiratory distress.     Breath sounds: Normal breath sounds. No wheezing or rales.  Chest:     Chest wall: No tenderness.  Musculoskeletal:        General: Tenderness present.     Right shoulder: She exhibits decreased range of motion, tenderness, pain and spasm.  Neurological:     Mental Status: She is alert and oriented to person, place, and time.    BP 118/70 (BP Location: Left Arm, Patient Position: Sitting, Cuff Size: Normal)   Pulse 86   Temp (!) 97.3 F (36.3 C) (Temporal)   Resp 18   Ht 5\' 3"  (1.6 m)   Wt 160 lb 3.2 oz (72.7 kg)   SpO2 97%   BMI 28.38 kg/m  Wt Readings from Last 3 Encounters:  10/01/18 160 lb 3.2 oz (72.7 kg)  04/23/18 163 lb (73.9 kg)  04/23/18 163 lb 3.2 oz (74 kg)     Lab Results  Component Value Date  WBC 7.3 04/23/2018   HGB 14.4 04/23/2018   HCT 42.5 04/23/2018   PLT 282.0 04/23/2018   GLUCOSE 79 04/23/2018   CHOL 254 (H) 04/23/2018   TRIG 158.0 (H) 04/23/2018   HDL 57.10 04/23/2018   LDLDIRECT 164.5 11/21/2012   LDLCALC 165 (H) 04/23/2018   ALT 15 04/23/2018   AST 16 04/23/2018   NA 137 04/23/2018   K 4.2 04/23/2018   CL 98 04/23/2018   CREATININE 0.80 04/23/2018   BUN 13 04/23/2018   CO2 31 04/23/2018   TSH 2.98 04/23/2018   HGBA1C 5.9 04/23/2018   MICROALBUR 0.1 11/21/2012    [place] Limited Joint Space Structures Up Right  Result Date: 04/25/2018 MSK US performed of: right This study was ordered, performed, and interpreted  by Charlann Boxer D.O. Shoulder: Supraspinatus: Appears normal on long and transverse views, no bursal bulge seen with shoulder abduction on impingement view. Infraspinatus: Appears normal on long and transverse views. Subscapularis: Appears normal on long and transverse views. Teres Minor: Appears normal on long and transverse views. AC joint: Capsular distention with moderate arthritic changes Glenohumeral Joint: Appears normal without effusion. Glenoid Labrum: Intact without visualized tears. Biceps Tendon: Appears normal on long and transverse views, no fraying of tendon, tendon located in intertubercular groove, no subluxation with shoulder internal or external rotation. No increased power doppler signal. Impression: Acromioclavicular arthritis mild     Assessment & Plan:  Plan  I have discontinued Olivia [name]'s fluticasone. I am also having her start on predniSONE. Additionally, I am having her maintain her aspirin-acetaminophen-caffeine and naproxen sodium.  Meds ordered this encounter  Medications  . predniSONE (DELTASONE) 10 MG tablet    Sig: TAKE 3 TABLETS PO QD FOR 3 DAYS THEN TAKE 2 TABLETS PO QD FOR 3 DAYS THEN TAKE 1 TABLET PO QD FOR 3 DAYS THEN TAKE 1/2 TAB PO QD FOR 3 DAYS    Dispense:  20 tablet    Refill:  0    Problem List Items Addressed This Visit    None    Visit Diagnoses    Acute pain of right shoulder    -  Primary   Relevant Medications   predniSONE (DELTASONE) 10 MG tablet   Other Relevant Orders   DG Shoulder Right   Ambulatory referral to Sports Medicine    pt given a sling to wear   Follow-up: Return if symptoms worsen or fail to improve.  Olivia Held, DO

## 2018-10-01 NOTE — Patient Instructions (Signed)
Shoulder Pain Many things can cause shoulder pain, including:  An injury to the shoulder.  Overuse of the shoulder.  Arthritis. The source of the pain can be:  Inflammation.  An injury to the shoulder joint.  An injury to a tendon, ligament, or bone. Follow these instructions at home: Pay attention to changes in your symptoms. Let your health care provider know about them. Follow these instructions to relieve your pain. If you have a sling:  Wear the sling as told by your health care provider. Remove it only as told by your health care provider.  Loosen the sling if your fingers tingle, become numb, or turn cold and blue.  Keep the sling clean.  If the sling is not waterproof: ? Do not let it get wet. Remove it to shower or bathe.  Move your arm as little as possible, but keep your hand moving to prevent swelling. Managing pain, stiffness, and swelling   If directed, put ice on the painful area: ? Put ice in a plastic bag. ? Place a towel between your skin and the bag. ? Leave the ice on for 20 minutes, 2-3 times per day. Stop applying ice if it does not help with the pain.  Squeeze a soft ball or a foam pad as much as possible. This helps to keep the shoulder from swelling. It also helps to strengthen the arm. General instructions  Take over-the-counter and prescription medicines only as told by your health care provider.  Keep all follow-up visits as told by your health care provider. This is important. Contact a health care provider if:  Your pain gets worse.  Your pain is not relieved with medicines.  New pain develops in your arm, hand, or fingers. Get help right away if:  Your arm, hand, or fingers: ? Tingle. ? Become numb. ? Become swollen. ? Become painful. ? Turn white or blue. Summary  Shoulder pain can be caused by an injury, overuse, or arthritis.  Pay attention to changes in your symptoms. Let your health care provider know about them.   This condition may be treated with a sling, ice, and pain medicines.  Contact your health care provider if the pain gets worse or new pain develops. Get help right away if your arm, hand, or fingers tingle or become numb, swollen, or painful.  Keep all follow-up visits as told by your health care provider. This is important. This information is not intended to replace advice given to you by your health care provider. Make sure you discuss any questions you have with your health care provider. Document Released: 11/03/2004 Document Revised: 08/08/2017 Document Reviewed: 08/08/2017 Elsevier Patient Education  2020 Elsevier Inc.  

## 2018-10-02 ENCOUNTER — Ambulatory Visit: Payer: Self-pay

## 2018-10-02 ENCOUNTER — Encounter: Payer: Self-pay | Admitting: Family Medicine

## 2018-10-02 ENCOUNTER — Ambulatory Visit (INDEPENDENT_AMBULATORY_CARE_PROVIDER_SITE_OTHER): Payer: No Typology Code available for payment source | Admitting: Family Medicine

## 2018-10-02 VITALS — BP 122/74 | HR 68 | Ht 63.0 in | Wt 157.0 lb

## 2018-10-02 DIAGNOSIS — M25511 Pain in right shoulder: Secondary | ICD-10-CM | POA: Diagnosis not present

## 2018-10-02 DIAGNOSIS — M75111 Incomplete rotator cuff tear or rupture of right shoulder, not specified as traumatic: Secondary | ICD-10-CM

## 2018-10-02 MED ORDER — MELOXICAM 15 MG PO TABS: 15.0000 mg | ORAL_TABLET | Freq: Every day | ORAL | 0 refills | Status: DC

## 2018-10-02 MED ORDER — VITAMIN D (ERGOCALCIFEROL) 1.25 MG (50000 UNIT) PO CAPS: 50000.0000 [IU] | ORAL_CAPSULE | ORAL | 0 refills | Status: DC

## 2018-10-02 NOTE — Progress Notes (Signed)
[name] [address] [place], [place] [address] Phone: [phone] Subjective:   [name], am serving as a scribe for Dr. Hulan Saas.  I'm seeing this patient by the request  of:    CC: Right shoulder pain  [id number]   04/23/2018 Right-sided. Home exercises given, discussed icing regimen and home exercises. Discussed topical anti-inflammatories and given trial. Worsening pain will consider injection and formal physical therapy.  Update 10/02/2018 Olivia [name] is a 63 y.o. female coming in with complaint of right shoulder pain. Patient developed intense pain over the weekend. Kayaked on Saturday. Took out a tandem kayak that is 80#. Width of boat is wider. Is unable to move shoulder away from her body. Patient presents in sling today. Comfortable with arm close to her body. Pain occurring over anterior aspect. Was called in prescription for prednisone but has not started it yet.      No past medical history on file. No past surgical history on file. Social History   Socioeconomic History  . Marital status: Married    Spouse name: Not on file  . Number of children: 2  . Years of education: Not on file  . Highest education level: Not on file  Occupational History  . Occupation: Chartered certified accountant: [employer]  . Financial resource strain: Not on file  . Food insecurity    Worry: Not on file    Inability: Not on file  . Transportation needs    Medical: Not on file    Non-medical: Not on file  Tobacco Use  . Smoking status: Never Smoker  . Smokeless tobacco: Never Used  Substance and Sexual Activity  . Alcohol use: Yes    Alcohol/week: 2.0 standard drinks    Types: 2 Standard drinks or equivalent per week  . Drug use: No  . Sexual activity: Yes    Partners: Male  Lifestyle  . Physical activity    Days per week: Not on file    Minutes per session: Not on file  . Stress: Not on file  Relationships   . Social Herbalist on phone: Not on file    Gets together: Not on file    Attends religious service: Not on file    Active member of club or organization: Not on file    Attends meetings of clubs or organizations: Not on file    Relationship status: Not on file  Other Topics Concern  . Not on file  Social History Narrative   Exercise---  3-4 x a week   No Known Allergies Family History  Problem Relation Age of Onset  . Arthritis Mother   . Dementia Mother   . Heart disease Father   . Stroke Father   . Hypertension Father   . Diabetes Father   . Cancer Father        liver    Current Outpatient Medications (Endocrine & Metabolic):  .  predniSONE (DELTASONE) 10 MG tablet, TAKE 3 TABLETS PO QD FOR 3 DAYS THEN TAKE 2 TABLETS PO QD FOR 3 DAYS THEN TAKE 1 TABLET PO QD FOR 3 DAYS THEN TAKE 1/2 TAB PO QD FOR 3 DAYS    Current Outpatient Medications (Analgesics):  .  aspirin-acetaminophen-caffeine (EXCEDRIN MIGRAINE) [id number] MG per tablet, Take 1 tablet by mouth every 6 (six) hours as needed. .  naproxen sodium (ALEVE) 220 MG tablet, Take 220 mg by mouth. Marland Kitchen  meloxicam (MOBIC) 15 MG tablet, Take 1 tablet (15 mg total) by mouth daily.   Current Outpatient Medications (Other):  Marland Kitchen  Vitamin D, Ergocalciferol, (DRISDOL) 1.25 MG (50000 UT) CAPS capsule, Take 1 capsule (50,000 Units total) by mouth every 7 (seven) days.    Past medical history, social, surgical and family history all reviewed in electronic medical record.  No pertanent information unless stated regarding to the chief complaint.   Review of Systems:  No headache, visual changes, nausea, vomiting, diarrhea, constipation, dizziness, abdominal pain, skin rash, fevers, chills, night sweats, weight loss, swollen lymph nodes, body aches, joint swelling, muscle aches, chest pain, shortness of breath, mood changes.   Objective  Blood pressure 122/74, pulse 68, height 5\' 3"  (1.6 m), weight 157 lb (71.2 kg), SpO2 97  %.     General: No apparent distress alert and oriented x3 mood and affect normal, dressed appropriately.  HEENT: Pupils equal, extraocular movements intact  Respiratory: Patient's speak in full sentences and does not appear short of breath  Cardiovascular: No lower extremity edema, non tender, no erythema  Skin: Warm dry intact with no signs of infection or rash on extremities or on axial skeleton.  Abdomen: Soft nontender  Neuro: Cranial nerves II through XII are intact, neurovascularly intact in all extremities with 2+ DTRs and 2+ pulses.  Lymph: No lymphadenopathy of posterior or anterior cervical chain or axillae bilaterally.  Gait normal with good balance and coordination.  MSK:  Non tender with full range of motion and good stability and symmetric strength and tone of  elbows, wrist, hip, knee and ankles bilaterally.  On inspection patient's right shoulder is in a sling.  Taken out.  Passively he has full range of motion.  Actively patient does have only 4 out of 5 strength of the rotator cuff.  Positive impingement noted.  Negative Speed and Yergason's.  Neurovascular intact distally.  Good grip strength.  Limited musculoskeletal ultrasound was performed and interpreted by Lyndal Pulley  Limited ultrasound of patient's right shoulder shows that patient's bicep tendon is unremarkable.  Patient's subscapularis he has hypoechoic changes but no true tear appreciated, possible abnormalities are noted on the anterior humerus.  Addition of this patient does have some calcific tendinitis of the supraspinatus with maybe a small partial tear but no significant retraction.  Thinning of the bones noted in the area.  Mild to moderate acromioclavicular arthritis also noted but no synovitis  [id number]; 15 additional minutes spent for Therapeutic exercises as stated in above notes.  This included exercises focusing on stretching, strengthening, with significant focus on eccentric aspects.   Long term goals  include an improvement in range of motion, strength, endurance as well as avoiding reinjury. Patient's frequency would include in 1-2 times a day, 3-5 times a week for a duration of 6-12 weeks. Shoulder Exercises that included:  Basic scapular stabilization to include adduction and depression of scapula Scaption, focusing on proper movement and good control Internal and External rotation utilizing a theraband, with elbow tucked at side entire time Rows with theraband which was given    Proper technique shown and discussed handout in great detail with ATC.  All questions were discussed and answered.      Impression and Recommendations:     This case required medical decision making of moderate complexity. The above documentation has been reviewed and is accurate and complete Lyndal Pulley, DO       Note: This dictation was prepared with Dragon dictation along  with smaller phrase technology. Any transcriptional errors that result from this process are unintentional.

## 2018-10-02 NOTE — Patient Instructions (Signed)
Meloxicam 15 mg- 10 days then as needed Ice 63min 2x a day Start exercises next week Once weekly vit D See me in 3-4 weeks

## 2018-10-02 NOTE — Assessment & Plan Note (Signed)
Partial tear of the rotator cuff.  I do not see any true retraction.  Hopefully more of this and tendinopathy giving more of the discomfort.  We discussed which activities of doing which wants to avoid.  Increase activity slowly.  Follow-up 4 to 8 weeks

## 2018-10-28 NOTE — Progress Notes (Signed)
[name] [address] [place], [place] [address] Phone: [phone] Subjective:   [name], am serving as a scribe for Dr. Hulan Saas.  I'm seeing this patient by the request  of:    CC: Shoulder pain follow-up  [id number]   10/02/2018 Partial tear of the rotator cuff.  I do not see any true retraction.  Hopefully more of this and tendinopathy giving more of the discomfort.  We discussed which activities of doing which wants to avoid.  Increase activity slowly.  Follow-up 4 to 8 weeks  Update 10/29/2018 Olivia [name] is a 63 y.o. female coming in with complaint of right shoulder pain. Is doing much better. Feels 95% better. Is able to perform ADLs. Does still have discomfort with flexion. Did do HEP. Has discontinued meloxicam. Did use all of the vitamin d. Has been using rowing machine at home.    Patient was last seen October 02, 2018 with a partial nontraumatic tear of the rotator cuff.  No past medical history on file. No past surgical history on file. Social History   Socioeconomic History  . Marital status: Married    Spouse name: Not on file  . Number of children: 2  . Years of education: Not on file  . Highest education level: Not on file  Occupational History  . Occupation: Chartered certified accountant: [employer]  . Financial resource strain: Not on file  . Food insecurity    Worry: Not on file    Inability: Not on file  . Transportation needs    Medical: Not on file    Non-medical: Not on file  Tobacco Use  . Smoking status: Never Smoker  . Smokeless tobacco: Never Used  Substance and Sexual Activity  . Alcohol use: Yes    Alcohol/week: 2.0 standard drinks    Types: 2 Standard drinks or equivalent per week  . Drug use: No  . Sexual activity: Yes    Partners: Male  Lifestyle  . Physical activity    Days per week: Not on file    Minutes per session: Not on file  . Stress: Not on file   Relationships  . Social Herbalist on phone: Not on file    Gets together: Not on file    Attends religious service: Not on file    Active member of club or organization: Not on file    Attends meetings of clubs or organizations: Not on file    Relationship status: Not on file  Other Topics Concern  . Not on file  Social History Narrative   Exercise---  3-4 x a week   No Known Allergies Family History  Problem Relation Age of Onset  . Arthritis Mother   . Dementia Mother   . Heart disease Father   . Stroke Father   . Hypertension Father   . Diabetes Father   . Cancer Father        liver    Current Outpatient Medications (Endocrine & Metabolic):  .  predniSONE (DELTASONE) 10 MG tablet, TAKE 3 TABLETS PO QD FOR 3 DAYS THEN TAKE 2 TABLETS PO QD FOR 3 DAYS THEN TAKE 1 TABLET PO QD FOR 3 DAYS THEN TAKE 1/2 TAB PO QD FOR 3 DAYS    Current Outpatient Medications (Analgesics):  .  aspirin-acetaminophen-caffeine (EXCEDRIN MIGRAINE) [id number] MG per tablet, Take 1 tablet by mouth every 6 (six) hours as needed. Marland Kitchen  meloxicam (MOBIC) 15 MG tablet, Take 1 tablet (15 mg total) by mouth daily. .  naproxen sodium (ALEVE) 220 MG tablet, Take 220 mg by mouth.   Current Outpatient Medications (Other):  Marland Kitchen  Vitamin D, Ergocalciferol, (DRISDOL) 1.25 MG (50000 UT) CAPS capsule, Take 1 capsule (50,000 Units total) by mouth every 7 (seven) days.    Past medical history, social, surgical and family history all reviewed in electronic medical record.  No pertanent information unless stated regarding to the chief complaint.   Review of Systems:  No headache, visual changes, nausea, vomiting, diarrhea, constipation, dizziness, abdominal pain, skin rash, fevers, chills, night sweats, weight loss, swollen lymph nodes, body aches, joint swelling, muscle aches, chest pain, shortness of breath, mood changes.   Objective  Blood pressure 110/72, pulse 85, height 5\' 3"  (1.6 m), weight 159 lb  (72.1 kg), SpO2 98 %.    General: No apparent distress alert and oriented x3 mood and affect normal, dressed appropriately.  HEENT: Pupils equal, extraocular movements intact  Respiratory: Patient's speak in full sentences and does not appear short of breath  Cardiovascular: No lower extremity edema, non tender, no erythema  Skin: Warm dry intact with no signs of infection or rash on extremities or on axial skeleton.  Abdomen: Soft nontender  Neuro: Cranial nerves II through XII are intact, neurovascularly intact in all extremities with 2+ DTRs and 2+ pulses.  Lymph: No lymphadenopathy of posterior or anterior cervical chain or axillae bilaterally.  Gait normal with good balance and coordination.  MSK:  Non tender with full range of motion and good stability and symmetric strength and tone of , elbows, wrist, hip, knee and ankles bilaterally.  Right shoulder exam has full range of motion, mild positive crossover.  4-5 strength of the rotator cuff compared to the contralateral side.  Contralateral shoulder unremarkable  Limited musculoskeletal ultrasound was performed and interpreted by Lyndal Pulley  Limited ultrasound of patient's right shoulder shows that the patient still has some intrasubstance tearing noted.  Patient has more scar tissue versus calcific changes of the rotator cuff.  Questionable uric acid deposits as well. Impression: Mild improvement of the rotator cuff tear   Impression and Recommendations:      The above documentation has been reviewed and is accurate and complete Lyndal Pulley, DO       Note: This dictation was prepared with Dragon dictation along with smaller phrase technology. Any transcriptional errors that result from this process are unintentional.

## 2018-10-29 ENCOUNTER — Ambulatory Visit: Payer: No Typology Code available for payment source | Admitting: Family Medicine

## 2018-10-29 ENCOUNTER — Other Ambulatory Visit: Payer: Self-pay

## 2018-10-29 ENCOUNTER — Ambulatory Visit: Payer: Self-pay

## 2018-10-29 ENCOUNTER — Encounter: Payer: Self-pay | Admitting: Family Medicine

## 2018-10-29 VITALS — BP 110/72 | HR 85 | Ht 63.0 in | Wt 159.0 lb

## 2018-10-29 DIAGNOSIS — G8929 Other chronic pain: Secondary | ICD-10-CM

## 2018-10-29 DIAGNOSIS — M75111 Incomplete rotator cuff tear or rupture of right shoulder, not specified as traumatic: Secondary | ICD-10-CM

## 2018-10-29 DIAGNOSIS — M25511 Pain in right shoulder: Secondary | ICD-10-CM | POA: Diagnosis not present

## 2018-10-29 NOTE — Patient Instructions (Signed)
Tart cherry 1200 mg [employer] Paddle in 3 weeks See me in 4 weeks

## 2018-10-29 NOTE — Assessment & Plan Note (Signed)
Patient seems to be improving.  Questionable scar tissue versus uric acid deposits noted no.  Discussed with patient about icing regimen, home exercises, what activities to do which wants to avoid.  Patient is to increase activity slowly.  Patient wants to get back to paddling her kayak but would like her to avoid that for another 3 weeks follow-up again 4 to 8 weeks

## 2018-12-03 ENCOUNTER — Encounter: Payer: Self-pay | Admitting: Family Medicine

## 2018-12-03 ENCOUNTER — Other Ambulatory Visit: Payer: Self-pay

## 2018-12-03 ENCOUNTER — Ambulatory Visit: Payer: No Typology Code available for payment source | Admitting: Family Medicine

## 2018-12-03 DIAGNOSIS — M75111 Incomplete rotator cuff tear or rupture of right shoulder, not specified as traumatic: Secondary | ICD-10-CM

## 2018-12-03 MED ORDER — VITAMIN D (ERGOCALCIFEROL) 1.25 MG (50000 UNIT) PO CAPS: 50000.0000 [IU] | ORAL_CAPSULE | ORAL | 0 refills | Status: DC

## 2018-12-03 NOTE — Progress Notes (Signed)
[name] [address], [place] [address] Phone: [phone] Subjective:   I Olivia [name] am serving as a Education administrator for Dr. Hulan Saas.   CC: Right shoulder pain follow-up  [id number]   10/29/2018 Patient seems to be improving.  Questionable scar tissue versus uric acid deposits noted no.  Discussed with patient about icing regimen, home exercises, what activities to do which wants to avoid.  Patient is to increase activity slowly.  Patient wants to get back to paddling her kayak but would like her to avoid that for another 3 weeks follow-up again 4 to 8 weeks  12/03/2018 Olivia [name] is a 63 y.o. female coming in with complaint of right shoulder pain. States she is doing better. No pain.      Patient is doing significantly better September 21 with a rotator cuff tear but on ultrasound showed some intermittent healing.  No past medical history on file. No past surgical history on file. Social History   Socioeconomic History  . Marital status: Married    Spouse name: Not on file  . Number of children: 2  . Years of education: Not on file  . Highest education level: Not on file  Occupational History  . Occupation: Chartered certified accountant: [employer]  . Financial resource strain: Not on file  . Food insecurity    Worry: Not on file    Inability: Not on file  . Transportation needs    Medical: Not on file    Non-medical: Not on file  Tobacco Use  . Smoking status: Never Smoker  . Smokeless tobacco: Never Used  Substance and Sexual Activity  . Alcohol use: Yes    Alcohol/week: 2.0 standard drinks    Types: 2 Standard drinks or equivalent per week  . Drug use: No  . Sexual activity: Yes    Partners: Male  Lifestyle  . Physical activity    Days per week: Not on file    Minutes per session: Not on file  . Stress: Not on file  Relationships  . Social Herbalist on phone: Not on file    Gets  together: Not on file    Attends religious service: Not on file    Active member of club or organization: Not on file    Attends meetings of clubs or organizations: Not on file    Relationship status: Not on file  Other Topics Concern  . Not on file  Social History Narrative   Exercise---  3-4 x a week   No Known Allergies Family History  Problem Relation Age of Onset  . Arthritis Mother   . Dementia Mother   . Heart disease Father   . Stroke Father   . Hypertension Father   . Diabetes Father   . Cancer Father        liver    Current Outpatient Medications (Endocrine & Metabolic):  .  predniSONE (DELTASONE) 10 MG tablet, TAKE 3 TABLETS PO QD FOR 3 DAYS THEN TAKE 2 TABLETS PO QD FOR 3 DAYS THEN TAKE 1 TABLET PO QD FOR 3 DAYS THEN TAKE 1/2 TAB PO QD FOR 3 DAYS    Current Outpatient Medications (Analgesics):  .  aspirin-acetaminophen-caffeine (EXCEDRIN MIGRAINE) [id number] MG per tablet, Take 1 tablet by mouth every 6 (six) hours as needed. .  meloxicam (MOBIC) 15 MG tablet, Take 1 tablet (15 mg total) by mouth daily. Marland Kitchen  naproxen sodium (ALEVE) 220 MG tablet, Take 220 mg by mouth.   Current Outpatient Medications (Other):  Marland Kitchen  Vitamin D, Ergocalciferol, (DRISDOL) 1.25 MG (50000 UT) CAPS capsule, Take 1 capsule (50,000 Units total) by mouth every 7 (seven) days. .  Vitamin D, Ergocalciferol, (DRISDOL) 1.25 MG (50000 UT) CAPS capsule, Take 1 capsule (50,000 Units total) by mouth every 7 (seven) days.    Past medical history, social, surgical and family history all reviewed in electronic medical record.  No pertanent information unless stated regarding to the chief complaint.   Review of Systems:  No headache, visual changes, nausea, vomiting, diarrhea, constipation, dizziness, abdominal pain, skin rash, fevers, chills, night sweats, weight loss, swollen lymph nodes, body aches, joint swelling, muscle aches, chest pain, shortness of breath, mood changes.   Objective  Blood  pressure 100/60, pulse 92, height 5\' 3"  (1.6 m), weight 160 lb (72.6 kg), SpO2 97 %.    General: No apparent distress alert and oriented x3 mood and affect normal, dressed appropriately.  HEENT: Pupils equal, extraocular movements intact  Respiratory: Patient's speak in full sentences and does not appear short of breath  Cardiovascular: No lower extremity edema, non tender, no erythema  Skin: Warm dry intact with no signs of infection or rash on extremities or on axial skeleton.  Abdomen: Soft nontender  Neuro: Cranial nerves II through XII are intact, neurovascularly intact in all extremities with 2+ DTRs and 2+ pulses.  Lymph: No lymphadenopathy of posterior or anterior cervical chain or axillae bilaterally.  Gait normal with good balance and coordination.  MSK:  Non tender with full range of motion and good stability and symmetric strength and tone of , elbows, wrist, hip, knee and ankles bilaterally.  Shoulder: right  Inspection reveals no abnormalities, atrophy or asymmetry. Palpation is normal with no tenderness over AC joint or bicipital groove. ROM is full in all planes. Rotator cuff strength normal throughout. No signs of impingement with negative Neer and Hawkin's tests, empty can sign. Speeds and Yergason's tests normal. No labral pathology noted with negative Obrien's, negative clunk and good stability. Normal scapular function observed. No painful arc and no drop arm sign. No apprehension sign  Contralateral shoulder unremarkable   Impression and Recommendations:      The above documentation has been reviewed and is accurate and complete Lyndal Pulley, DO       Note: This dictation was prepared with Dragon dictation along with smaller phrase technology. Any transcriptional errors that result from this process are unintentional.

## 2018-12-03 NOTE — Assessment & Plan Note (Signed)
Patient is still having bilateral.  Discussed with patient and patient is currently icing regimen, which activities to do which wants to avoid.  Patient should increase activity as tolerated.  Follow-up as needed with no restrictions.

## 2019-01-07 ENCOUNTER — Other Ambulatory Visit: Payer: Self-pay | Admitting: Family Medicine

## 2019-02-07 ENCOUNTER — Encounter: Payer: Self-pay | Admitting: Family Medicine

## 2019-02-11 ENCOUNTER — Other Ambulatory Visit: Payer: Self-pay

## 2019-02-11 MED ORDER — VITAMIN D (ERGOCALCIFEROL) 1.25 MG (50000 UNIT) PO CAPS: 50000.0000 [IU] | ORAL_CAPSULE | ORAL | 0 refills | Status: DC

## 2019-04-26 ENCOUNTER — Other Ambulatory Visit: Payer: Self-pay

## 2019-04-29 ENCOUNTER — Encounter: Payer: Self-pay | Admitting: Family Medicine

## 2019-04-29 ENCOUNTER — Other Ambulatory Visit: Payer: Self-pay

## 2019-04-29 ENCOUNTER — Ambulatory Visit (INDEPENDENT_AMBULATORY_CARE_PROVIDER_SITE_OTHER): Payer: PPO | Admitting: Family Medicine

## 2019-04-29 ENCOUNTER — Other Ambulatory Visit (HOSPITAL_BASED_OUTPATIENT_CLINIC_OR_DEPARTMENT_OTHER): Payer: Self-pay | Admitting: Family Medicine

## 2019-04-29 VITALS — BP 115/63 | HR 80 | Temp 95.9°F | Resp 16 | Ht 64.75 in | Wt 161.1 lb

## 2019-04-29 DIAGNOSIS — R739 Hyperglycemia, unspecified: Secondary | ICD-10-CM

## 2019-04-29 DIAGNOSIS — Z23 Encounter for immunization: Secondary | ICD-10-CM

## 2019-04-29 DIAGNOSIS — I4510 Unspecified right bundle-branch block: Secondary | ICD-10-CM

## 2019-04-29 DIAGNOSIS — E559 Vitamin D deficiency, unspecified: Secondary | ICD-10-CM

## 2019-04-29 DIAGNOSIS — Z0000 Encounter for general adult medical examination without abnormal findings: Secondary | ICD-10-CM

## 2019-04-29 DIAGNOSIS — Z1231 Encounter for screening mammogram for malignant neoplasm of breast: Secondary | ICD-10-CM

## 2019-04-29 LAB — COMPREHENSIVE METABOLIC PANEL
ALT: 20 U/L (ref 0–35)
AST: 19 U/L (ref 0–37)
Albumin: 4.3 g/dL (ref 3.5–5.2)
Alkaline Phosphatase: 91 U/L (ref 39–117)
BUN: 14 mg/dL (ref 6–23)
CO2: 29 mEq/L (ref 19–32)
Calcium: 9.6 mg/dL (ref 8.4–10.5)
Chloride: 101 mEq/L (ref 96–112)
Creatinine, Ser: 0.77 mg/dL (ref 0.40–1.20)
GFR: 75.17 mL/min (ref >60.00)
Glucose, Bld: 94 mg/dL (ref 70–99)
Potassium: 4.4 mEq/L (ref 3.5–5.1)
Sodium: 138 mEq/L (ref 135–145)
Total Bilirubin: 0.5 mg/dL (ref 0.2–1.2)
Total Protein: 7.4 g/dL (ref 6.0–8.3)

## 2019-04-29 LAB — CBC WITH DIFFERENTIAL/PLATELET
Basophils Absolute: 0.1 10*3/uL (ref 0.0–0.1)
Basophils Relative: 1 % (ref 0.0–3.0)
Eosinophils Absolute: 0.3 10*3/uL (ref 0.0–0.7)
Eosinophils Relative: 4.7 % (ref 0.0–5.0)
HCT: 42.2 % (ref 36.0–46.0)
Hemoglobin: 14.3 g/dL (ref 12.0–15.0)
Lymphocytes Relative: 27.2 % (ref 12.0–46.0)
Lymphs Abs: 2 10*3/uL (ref 0.7–4.0)
MCHC: 33.8 g/dL (ref 30.0–36.0)
MCV: 88.9 fl (ref 78.0–100.0)
Monocytes Absolute: 0.5 10*3/uL (ref 0.1–1.0)
Monocytes Relative: 7.2 % (ref 3.0–12.0)
Neutro Abs: 4.4 10*3/uL (ref 1.4–7.7)
Neutrophils Relative %: 59.9 % (ref 43.0–77.0)
Platelets: 257 10*3/uL (ref 150.0–400.0)
RBC: 4.75 Mil/uL (ref 3.87–5.11)
RDW: 13.2 % (ref 11.5–15.5)
WBC: 7.4 10*3/uL (ref 4.0–10.5)

## 2019-04-29 LAB — HEMOGLOBIN A1C: Hgb A1c MFr Bld: 5.8 % (ref 4.6–6.5)

## 2019-04-29 LAB — LIPID PANEL
Cholesterol: 244 mg/dL — ABNORMAL HIGH (ref 0–200)
HDL: 57.4 mg/dL (ref >39.00)
LDL Cholesterol: 157 mg/dL — ABNORMAL HIGH (ref 0–99)
NonHDL: 186.91
Total CHOL/HDL Ratio: 4
Triglycerides: 148 mg/dL (ref 0.0–149.0)
VLDL: 29.6 mg/dL (ref 0.0–40.0)

## 2019-04-29 LAB — VITAMIN D 25 HYDROXY (VIT D DEFICIENCY, FRACTURES): VITD: 48.83 ng/mL (ref 30.00–100.00)

## 2019-04-29 MED ORDER — VITAMIN D (ERGOCALCIFEROL) 1.25 MG (50000 UNIT) PO CAPS: 50000.0000 [IU] | ORAL_CAPSULE | ORAL | 1 refills | Status: DC

## 2019-04-29 NOTE — Progress Notes (Signed)
Subjective:    Olivia [name] is a 64 y.o. female who presents for a Welcome to Medicare exam.   Review of Systems  Review of Systems  Constitutional: Negative for activity change, appetite change and fatigue.  HENT: Negative for hearing loss, congestion, tinnitus and ear discharge.   Eyes: Negative for visual disturbance (see optho q1y -- vision corrected to 20/20 with glasses).  Respiratory: Negative for cough, chest tightness and shortness of breath.   Cardiovascular: Negative for chest pain, palpitations and leg swelling.  Gastrointestinal: Negative for abdominal pain, diarrhea, constipation and abdominal distention.  Genitourinary: Negative for urgency, frequency, decreased urine volume and difficulty urinating.  Musculoskeletal: Negative for back pain, arthralgias and gait problem.  Skin: Negative for color change, pallor and rash.  Neurological: Negative for dizziness, light-headedness, numbness and headaches.  Hematological: Negative for adenopathy. Does not bruise/bleed easily.  Psychiatric/Behavioral: Negative for suicidal ideas, confusion, sleep disturbance, self-injury, dysphoric mood, decreased concentration and agitation.  Pt is able to read and write and can do all ADLs No risk for falling No abuse/ violence in home           Objective:    Today's Vitals   04/29/19 0906  BP: 115/63  Pulse: 80  Resp: 16  Temp: (!) 95.9 F (35.5 C)  TempSrc: Temporal  SpO2: 99%  Weight: 161 lb 2 oz (73.1 kg)  Height: 5' 4.75" (1.645 m)  Body mass index is 27.02 kg/m.  Medications Outpatient Encounter Medications as of 04/29/2019  Medication Sig  . aspirin-acetaminophen-caffeine (EXCEDRIN MIGRAINE) [id number] MG per tablet Take 1 tablet by mouth every 6 (six) hours as needed.  . naproxen sodium (ALEVE) 220 MG tablet Take 220 mg by mouth.  . Vitamin D, Ergocalciferol, (DRISDOL) 1.25 MG (50000 UNIT) CAPS capsule Take 1 capsule (50,000 Units total) by mouth every 7  (seven) days.  . [DISCONTINUED] Vitamin D, Ergocalciferol, (DRISDOL) 1.25 MG (50000 UT) CAPS capsule Take 1 capsule (50,000 Units total) by mouth every 7 (seven) days.  . [DISCONTINUED] meloxicam (MOBIC) 15 MG tablet Take 1 tablet (15 mg total) by mouth daily. (Patient not taking: Reported on 04/29/2019)  . [DISCONTINUED] predniSONE (DELTASONE) 10 MG tablet TAKE 3 TABLETS PO QD FOR 3 DAYS THEN TAKE 2 TABLETS PO QD FOR 3 DAYS THEN TAKE 1 TABLET PO QD FOR 3 DAYS THEN TAKE 1/2 TAB PO QD FOR 3 DAYS (Patient not taking: Reported on 04/29/2019)  . [DISCONTINUED] Vitamin D, Ergocalciferol, (DRISDOL) 1.25 MG (50000 UT) CAPS capsule Take 1 capsule (50,000 Units total) by mouth every 7 (seven) days. (Patient not taking: Reported on 04/29/2019)   No facility-administered encounter medications on file as of 04/29/2019.     History: History reviewed. No pertinent past medical history. History reviewed. No pertinent surgical history.  Family History  Problem Relation Age of Onset  . Arthritis Mother   . Dementia Mother   . Heart disease Father   . Stroke Father   . Hypertension Father   . Diabetes Father   . Cancer Father        liver   Social History   Occupational History  . 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 Smoking status: Never Smoker  . Smokeless tobacco: Never Used  Substance and Sexual Activity  . Alcohol use: Yes    Alcohol/week: 2.0 standard drinks    Types: 2 Standard drinks or equivalent per week  . Drug use:  No  . Sexual activity: Yes    Partners: Male    Tobacco Counseling Counseling given: Yes   Immunizations and Health Maintenance Immunization History  Administered Date(s) Administered  . Influenza, Seasonal, Injecte, Preservative Fre 11/19/2011  . Influenza,inj,Quad PF,6+ Mos 11/02/2012, 11/10/2014  . Influenza-Unspecified 11/27/2015, 11/04/2016, 10/09/2018  . PFIZER SARS-COV-2 Vaccination 02/16/2019, 03/07/2019  .  Tdap 09/08/2010  . Zoster Recombinat (Shingrix) 10/31/2017, 03/02/2018   Health Maintenance Due  Topic Date Due  . HIV Screening  Never done  . MAMMOGRAM  08/19/2017  . PNA vac Low Risk Adult (1 of 2 - PCV13) Never done       Activities of Daily Living In your present state of health, do you have any difficulty performing the following activities: 04/29/2019  Hearing? N  Vision? N  Difficulty concentrating or making decisions? N  Walking or climbing stairs? N  Dressing or bathing? N  Doing errands, shopping? N  Some recent data might be hidden    Physical Exam   BP 115/63 (BP Location: Left Arm, Patient Position: Sitting, Cuff Size: Normal)   Pulse 80   Temp (!) 95.9 F (35.5 C) (Temporal)   Resp 16   Ht 5' 4.75" (1.645 m)   Wt 161 lb 2 oz (73.1 kg)   SpO2 99%   BMI 27.02 kg/m  General appearance: alert, cooperative, appears stated age and no distress Head: Normocephalic, without obvious abnormality, atraumatic Eyes: conjunctivae/corneas clear. PERRL, EOM's intact. Fundi benign. Ears: normal TM's and external ear canals both ears Nose: Nares normal. Septum midline. Mucosa normal. No drainage or sinus tenderness. Throat: lips, mucosa, and tongue normal; teeth and gums normal Neck: no adenopathy, no carotid bruit, no JVD, supple, symmetrical, trachea midline and thyroid not enlarged, symmetric, no tenderness/mass/nodules Back: symmetric, no curvature. ROM normal. No CVA tenderness. Lungs: clear to auscultation bilaterally Breasts: pt preferred no exam Heart: regular rate and rhythm, S1, S2 normal, no murmur, click, rub or gallop Abdomen: soft, non-tender; bowel sounds normal; no masses,  no organomegaly Pelvic: not indicated; post-menopausal, no abnormal Pap smears in past Extremities: extremities normal, atraumatic, no cyanosis or edema Pulses: 2+ and symmetric Skin: Skin color, texture, turgor normal. No rashes or lesions Lymph nodes: Cervical, supraclavicular, and  axillary nodes normal. Neurologic: Alert and oriented X 3, normal strength and tone. Normal symmetric reflexes. Normal coordination and gait  Advanced Directives: Does Patient Have a Medical Advance Directive?: No Would patient like information on creating a medical advance directive?: No - Patient declined    Assessment:    This is a routine wellness examination for this patient .   Vision/Hearing screen  Hearing Screening   125Hz  250Hz  500Hz  1000Hz  2000Hz  3000Hz  4000Hz  6000Hz  8000Hz   Right ear:           Left ear:           Comments: Normal whisper    Visual Acuity Screening   Right eye Left eye Both eyes  Without correction:     With correction: 20/20 20/20 20/20     Dietary issues and exercise activities discussed:  Current Exercise Habits: Home exercise routine, Type of exercise: walking;Other - see comments(swim, kayak and walk), Time (Minutes): 60, Frequency (Times/Week): 7, Weekly Exercise (Minutes/Week): 420, Intensity: Moderate, Exercise limited by: None identified  Goals   None    Depression Screen PHQ 2/9 Scores 04/29/2019 04/21/2017 02/15/2016 11/21/2012  PHQ - 2 Score 0 0 0 1     Fall Risk Fall Risk  04/29/2019  Falls in the past year? 0  Number falls in past yr: 0  Injury with Fall? 0  Follow up Falls evaluation completed    Cognitive Function: MMSE - Mini Mental State Exam 04/29/2019  Orientation to time 5  Orientation to Place 5  Registration 3  Attention/ Calculation 5  Recall 3  Language- name 2 objects 2  Language- repeat 1  Language- follow 3 step command 3  Language- read & follow direction 1  Write a sentence 1  Copy design 1  Total score 30        Patient Care Team: Carollee Herter, Alferd Apa, DO as PCP - General (Family Medicine) [place], [place], [place] (Optometry) [place]., MD as Referring Physician (Gastroenterology)     Plan:    see ave ghm utd rto 1 year   I have personally reviewed and noted the following in the patient's  chart:   . Medical and social history . Use of alcohol, tobacco or illicit drugs  . Current medications and supplements . Functional ability and status . Nutritional status . Physical activity . Advanced directives . List of other physicians . Hospitalizations, surgeries, and ER visits in previous 12 months . Vitals . Screenings to include cognitive, depression, and falls . Referrals and appointments  In addition, I have reviewed and discussed with patient certain preventive protocols, quality metrics, and best practice recommendations. A written personalized care plan for preventive services as well as general preventive health recommendations were provided to patient.    1. Welcome to Medicare preventive visit See above  - EKG 12-Lead - Lipid panel - CBC with Differential/Platelet - Comprehensive metabolic panel  2. Hyperglycemia Check labs  - Hemoglobin A1c  3. Vitamin D deficiency Check labs  - Vitamin D (25 hydroxy) - Vitamin D, Ergocalciferol, (DRISDOL) 1.25 MG (50000 UNIT) CAPS capsule; Take 1 capsule (50,000 Units total) by mouth every 7 (seven) days.  Dispense: 12 capsule; Refill: 1  4. Encounter for Medicare annual wellness exam    5. Need for pneumococcal vaccination   - Pneumococcal conjugate vaccine 13-valent IM  6. Incomplete right bundle branch block New----refer to cardiology - Ambulatory referral to Cardiology   Ann Held, DO 04/29/2019

## 2019-04-29 NOTE — Patient Instructions (Addendum)
Preventive Care 38 Years and Older, Female Preventive care refers to lifestyle choices and visits with your health care provider that can promote health and wellness. This includes:  A yearly physical exam. This is also called an annual well check.  Regular dental and eye exams.  Immunizations.  Screening for certain conditions.  Healthy lifestyle choices, such as diet and exercise. What can I expect for my preventive care visit? Physical exam Your health care provider will check:  Height and weight. These may be used to calculate body mass index (BMI), which is a measurement that tells if you are at a healthy weight.  Heart rate and blood pressure.  Your skin for abnormal spots. Counseling Your health care provider may ask you questions about:  Alcohol, tobacco, and drug use.  Emotional well-being.  Home and relationship well-being.  Sexual activity.  Eating habits.  History of falls.  Memory and ability to understand (cognition).  Work and work Statistician.  Pregnancy and menstrual history. What immunizations do I need?  Influenza (flu) vaccine  This is recommended every year. Tetanus, diphtheria, and pertussis (Tdap) vaccine  You may need a Td booster every 10 years. Varicella (chickenpox) vaccine  You may need this vaccine if you have not already been vaccinated. Zoster (shingles) vaccine  You may need this after age 33. Pneumococcal conjugate (PCV13) vaccine  One dose is recommended after age 33. Pneumococcal polysaccharide (PPSV23) vaccine  One dose is recommended after age 72. Measles, mumps, and rubella (MMR) vaccine  You may need at least one dose of MMR if you were born in 1957 or later. You may also need a second dose. Meningococcal conjugate (MenACWY) vaccine  You may need this if you have certain conditions. Hepatitis A vaccine  You may need this if you have certain conditions or if you travel or work in places where you may be exposed  to hepatitis A. Hepatitis B vaccine  You may need this if you have certain conditions or if you travel or work in places where you may be exposed to hepatitis B. Haemophilus influenzae type b (Hib) vaccine  You may need this if you have certain conditions. You may receive vaccines as individual doses or as more than one vaccine together in one shot (combination vaccines). Talk with your health care provider about the risks and benefits of combination vaccines. What tests do I need? Blood tests  Lipid and cholesterol levels. These may be checked every 5 years, or more frequently depending on your overall health.  Hepatitis C test.  Hepatitis B test. Screening  Lung cancer screening. You may have this screening every year starting at age 39 if you have a 30-pack-year history of smoking and currently smoke or have quit within the past 15 years.  Colorectal cancer screening. All adults should have this screening starting at age 36 and continuing until age 15. Your health care provider may recommend screening at age 23 if you are at increased risk. You will have tests every 1-10 years, depending on your results and the type of screening test.  Diabetes screening. This is done by checking your blood sugar (glucose) after you have not eaten for a while (fasting). You may have this done every 1-3 years.  Mammogram. This may be done every 1-2 years. Talk with your health care provider about how often you should have regular mammograms.  BRCA-related cancer screening. This may be done if you have a family history of breast, ovarian, tubal, or peritoneal cancers.  Other tests  Sexually transmitted disease (STD) testing.  Bone density scan. This is done to screen for osteoporosis. You may have this done starting at age 65. Follow these instructions at home: Eating and drinking  Eat a diet that includes fresh fruits and vegetables, whole grains, lean protein, and low-fat dairy products. Limit  your intake of foods with high amounts of sugar, saturated fats, and salt.  Take vitamin and mineral supplements as recommended by your health care provider.  Do not drink alcohol if your health care provider tells you not to drink.  If you drink alcohol: ? Limit how much you have to 0-1 drink a day. ? Be aware of how much alcohol is in your drink. In the [place] one drink equals one 12 oz bottle of beer (355 mL), one 5 oz glass of wine (148 mL), or one 1 oz glass of hard liquor (44 mL). Lifestyle  Take daily care of your teeth and gums.  Stay active. Exercise for at least 30 minutes on 5 or more days each week.  Do not use any products that contain nicotine or tobacco, such as cigarettes, e-cigarettes, and chewing tobacco. If you need help quitting, ask your health care provider.  If you are sexually active, practice safe sex. Use a condom or other form of protection in order to prevent STIs (sexually transmitted infections).  Talk with your health care provider about taking a low-dose aspirin or statin. What's next?  Go to your health care provider once a year for a well check visit.  Ask your health care provider how often you should have your eyes and teeth checked.  Stay up to date on all vaccines. This information is not intended to replace advice given to you by your health care provider. Make sure you discuss any questions you have with your health care provider. Document Revised: 01/18/2018 Document Reviewed: 01/18/2018 Elsevier Patient Education  2020 [employer]  Preventive Care 65 Years and Older, Female Preventive care refers to lifestyle choices and visits with your health care provider that can promote health and wellness. This includes:  A yearly physical exam. This is also called an annual well check.  Regular dental and eye exams.  Immunizations.  Screening for certain conditions.  Healthy lifestyle choices, such as diet and exercise. What can I expect  for my preventive care visit? Physical exam Your health care provider will check:  Height and weight. These may be used to calculate body mass index (BMI), which is a measurement that tells if you are at a healthy weight.  Heart rate and blood pressure.  Your skin for abnormal spots. Counseling Your health care provider may ask you questions about:  Alcohol, tobacco, and drug use.  Emotional well-being.  Home and relationship well-being.  Sexual activity.  Eating habits.  History of falls.  Memory and ability to understand (cognition).  Work and work environment.  Pregnancy and menstrual history. What immunizations do I need?  Influenza (flu) vaccine  This is recommended every year. Tetanus, diphtheria, and pertussis (Tdap) vaccine  You may need a Td booster every 10 years. Varicella (chickenpox) vaccine  You may need this vaccine if you have not already been vaccinated. Zoster (shingles) vaccine  You may need this after age 60. Pneumococcal conjugate (PCV13) vaccine  One dose is recommended after age 65. Pneumococcal polysaccharide (PPSV23) vaccine  One dose is recommended after age 65. Measles, mumps, and rubella (MMR) vaccine  You may need at least one dose   of MMR if you were born in 1957 or later. You may also need a second dose. Meningococcal conjugate (MenACWY) vaccine  You may need this if you have certain conditions. Hepatitis A vaccine  You may need this if you have certain conditions or if you travel or work in places where you may be exposed to hepatitis A. Hepatitis B vaccine  You may need this if you have certain conditions or if you travel or work in places where you may be exposed to hepatitis B. Haemophilus influenzae type b (Hib) vaccine  You may need this if you have certain conditions. You may receive vaccines as individual doses or as more than one vaccine together in one shot (combination vaccines). Talk with your health care  provider about the risks and benefits of combination vaccines. What tests do I need? Blood tests  Lipid and cholesterol levels. These may be checked every 5 years, or more frequently depending on your overall health.  Hepatitis C test.  Hepatitis B test. Screening  Lung cancer screening. You may have this screening every year starting at age 55 if you have a 30-pack-year history of smoking and currently smoke or have quit within the past 15 years.  Colorectal cancer screening. All adults should have this screening starting at age 50 and continuing until age 75. Your health care provider may recommend screening at age 45 if you are at increased risk. You will have tests every 1-10 years, depending on your results and the type of screening test.  Diabetes screening. This is done by checking your blood sugar (glucose) after you have not eaten for a while (fasting). You may have this done every 1-3 years.  Mammogram. This may be done every 1-2 years. Talk with your health care provider about how often you should have regular mammograms.  BRCA-related cancer screening. This may be done if you have a family history of breast, ovarian, tubal, or peritoneal cancers. Other tests  Sexually transmitted disease (STD) testing.  Bone density scan. This is done to screen for osteoporosis. You may have this done starting at age 65. Follow these instructions at home: Eating and drinking  Eat a diet that includes fresh fruits and vegetables, whole grains, lean protein, and low-fat dairy products. Limit your intake of foods with high amounts of sugar, saturated fats, and salt.  Take vitamin and mineral supplements as recommended by your health care provider.  Do not drink alcohol if your health care provider tells you not to drink.  If you drink alcohol: ? Limit how much you have to 0-1 drink a day. ? Be aware of how much alcohol is in your drink. In the [place] one drink equals one 12 oz bottle of  beer (355 mL), one 5 oz glass of wine (148 mL), or one 1 oz glass of hard liquor (44 mL). Lifestyle  Take daily care of your teeth and gums.  Stay active. Exercise for at least 30 minutes on 5 or more days each week.  Do not use any products that contain nicotine or tobacco, such as cigarettes, e-cigarettes, and chewing tobacco. If you need help quitting, ask your health care provider.  If you are sexually active, practice safe sex. Use a condom or other form of protection in order to prevent STIs (sexually transmitted infections).  Talk with your health care provider about taking a low-dose aspirin or statin. What's next?  Go to your health care provider once a year for a well check visit.    Ask your health care provider how often you should have your eyes and teeth checked.  Stay up to date on all vaccines. This information is not intended to replace advice given to you by your health care provider. Make sure you discuss any questions you have with your health care provider. Document Revised: 01/18/2018 Document Reviewed: 01/18/2018 Elsevier Patient Education  2020 [employer]  

## 2019-05-01 ENCOUNTER — Other Ambulatory Visit: Payer: Self-pay

## 2019-05-01 ENCOUNTER — Ambulatory Visit (HOSPITAL_BASED_OUTPATIENT_CLINIC_OR_DEPARTMENT_OTHER)
Admission: RE | Admit: 2019-05-01 | Discharge: 2019-05-01 | Disposition: A | Discharge status: Home / Self Care | Payer: PPO | Source: Ambulatory Visit | Attending: Family Medicine | Admitting: Family Medicine

## 2019-05-01 DIAGNOSIS — Z1231 Encounter for screening mammogram for malignant neoplasm of breast: Secondary | ICD-10-CM

## 2019-05-02 ENCOUNTER — Ambulatory Visit (INDEPENDENT_AMBULATORY_CARE_PROVIDER_SITE_OTHER): Payer: PPO | Admitting: Cardiology

## 2019-05-02 ENCOUNTER — Encounter: Payer: Self-pay | Admitting: Cardiology

## 2019-05-02 VITALS — BP 134/80 | HR 82 | Ht 64.75 in | Wt 162.0 lb

## 2019-05-02 DIAGNOSIS — E782 Mixed hyperlipidemia: Secondary | ICD-10-CM

## 2019-05-02 DIAGNOSIS — R9431 Abnormal electrocardiogram [ECG] [EKG]: Secondary | ICD-10-CM

## 2019-05-02 NOTE — Patient Instructions (Signed)
Medication Instructions:  Your physician recommends that you continue on your current medications as directed. Please refer to the Current Medication list given to you today.  *If you need a refill on your cardiac medications before your next appointment, please call your pharmacy*   Lab Work: BEFORE YOUR NEXT OFFICE VISIT:  COME TO THE LAB A COUPLE DAYS PRIOR, FASTING, NOTHING TO EAT OR DRINK AFTER MIDNIGHT THE NIGHT BEFORE TO GET LABS DONE  If you have labs (blood work) drawn today and your tests are completely normal, you will receive your results only by: Marland Kitchen MyChart Message (if you have MyChart) OR . A paper copy in the mail If you have any lab test that is abnormal or we need to change your treatment, we will call you to review the results.   Testing/Procedures: Your physician has requested that you have an echocardiogram. Echocardiography is a painless test that uses sound waves to create images of your heart. It provides your doctor with information about the size and shape of your heart and how well your heart's chambers and valves are working. This procedure takes approximately one hour. There are no restrictions for this procedure.     Follow-Up: At [facility], you and your health needs are our priority.  As part of our continuing mission to provide you with exceptional heart care, we have created designated Provider Care Teams.  These Care Teams include your primary Cardiologist (physician) and Advanced Practice Providers (APPs -  Physician Assistants and Nurse Practitioners) who all work together to provide you with the care you need, when you need it.  We recommend signing up for the patient portal called "MyChart".  Sign up information is provided on this After Visit Summary.  MyChart is used to connect with patients for Virtual Visits (Telemedicine).  Patients are able to view lab/test results, encounter notes, upcoming appointments, etc.  Non-urgent messages can be sent to  your provider as well.   To learn more about what you can do with MyChart, go to [email or web address].    Your next appointment:   3 month(s)  The format for your next appointment:   In Person  Provider:   Berniece Salines, DO   Other Instructions  Echocardiogram An echocardiogram is a procedure that uses painless sound waves (ultrasound) to produce an image of the heart. Images from an echocardiogram can provide important information about:  Signs of coronary artery disease (CAD).  Aneurysm detection. An aneurysm is a weak or damaged part of an artery wall that bulges out from the normal force of blood pumping through the body.  Heart size and shape. Changes in the size or shape of the heart can be associated with certain conditions, including heart failure, aneurysm, and CAD.  Heart muscle function.  Heart valve function.  Signs of a past heart attack.  Fluid buildup around the heart.  Thickening of the heart muscle.  A tumor or infectious growth around the heart valves. Tell a health care provider about:  Any allergies you have.  All medicines you are taking, including vitamins, herbs, eye drops, creams, and over-the-counter medicines.  Any blood disorders you have.  Any surgeries you have had.  Any medical conditions you have.  Whether you are pregnant or may be pregnant. What are the risks? Generally, this is a safe procedure. However, problems may occur, including:  Allergic reaction to dye (contrast) that may be used during the procedure. What happens before the procedure? No specific preparation is needed.  You may eat and drink normally. What happens during the procedure?   An IV tube may be inserted into one of your veins.  You may receive contrast through this tube. A contrast is an injection that improves the quality of the pictures from your heart.  A gel will be applied to your chest.  A wand-like tool (transducer) will be moved over your  chest. The gel will help to transmit the sound waves from the transducer.  The sound waves will harmlessly bounce off of your heart to allow the heart images to be captured in real-time motion. The images will be recorded on a computer. The procedure may vary among health care providers and hospitals. What happens after the procedure?  You may return to your normal, everyday life, including diet, activities, and medicines, unless your health care provider tells you not to do that. Summary  An echocardiogram is a procedure that uses painless sound waves (ultrasound) to produce an image of the heart.  Images from an echocardiogram can provide important information about the size and shape of your heart, heart muscle function, heart valve function, and fluid buildup around your heart.  You do not need to do anything to prepare before this procedure. You may eat and drink normally.  After the echocardiogram is completed, you may return to your normal, everyday life, unless your health care provider tells you not to do that. This information is not intended to replace advice given to you by your health care provider. Make sure you discuss any questions you have with your health care provider. Document Revised: 05/17/2018 Document Reviewed: 02/27/2016 Elsevier Patient Education  [place].

## 2019-05-02 NOTE — Progress Notes (Signed)
Cardiology Office Note:    Date:  05/02/2019   ID:  Olivia [name], Olivia [name] Aug 12, 1955, MRN [id number]  PCP:  Ann Held, DO  Cardiologist:  No primary care provider on file.  Electrophysiologist:  None   Referring MD: Carollee Herter, Alferd Apa, *   Chief Complaint  Patient presents with  . Abnormal ECG    History of Present Illness:    Olivia [name] is a 64 y.o. female with a hx of vitamin D deficiency transferred to be evaluated for abnormal EKG.  Patient did see her her PCP and her baseline EKG was reported to her as abnormal.  She was asked to see cardiology.  She is here today to be evaluated for abnormal EKG. She denies any chest pain, but tells me that she has been experiencing shortness of breath with exercise but seems to be improving.  No other complaints at this time.   No past medical history on file.  No past surgical history on file.  Current Medications: Current Meds  Medication Sig  . aspirin-acetaminophen-caffeine (EXCEDRIN MIGRAINE) 250-250-65 MG per tablet Take 1 tablet by mouth every 6 (six) hours as needed.  . naproxen sodium (ALEVE) 220 MG tablet Take 220 mg by mouth.  . Vitamin D, Ergocalciferol, (DRISDOL) 1.25 MG (50000 UNIT) CAPS capsule Take 1 capsule (50,000 Units total) by mouth every 7 (seven) days.  . [DISCONTINUED] calcium-vitamin D (OSCAL WITH D) 500-200 MG-UNIT TABS tablet Take by mouth.     Allergies:   Patient has no known allergies.   Social History   Socioeconomic History  . Marital status: Married    Spouse name: Not on file  . Number of children: 2  . Years of education: Not on file  . Highest education level: Not on file  Occupational History  . 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 Smoking status: Never Smoker  . Smokeless tobacco: Never Used  Substance and Sexual Activity  . Alcohol use: Yes    Alcohol/week: 2.0 standard drinks    Types: 2 Standard drinks or  equivalent per week  . Drug use: No  . Sexual activity: Yes    Partners: Male  Other Topics Concern  . Not on file  Social History Narrative   Exercise---  3-4 x a week      Social Determinants of Health   Financial Resource Strain:   . Difficulty of Paying Living Expenses:   Food Insecurity:   . Worried About Charity fundraiser in the Last Year:   . Arboriculturist in the Last Year:   Transportation Needs:   . Film/video editor (Medical):   Marland Kitchen Lack of Transportation (Non-Medical):   Physical Activity:   . Days of Exercise per Week:   . Minutes of Exercise per Session:   Stress:   . Feeling of Stress :   Social Connections:   . Frequency of Communication with Friends and Family:   . Frequency of Social Gatherings with Friends and Family:   . Attends Religious Services:   . Active Member of Clubs or Organizations:   . Attends Archivist Meetings:   Marland Kitchen Marital Status:      Family History: The patient's family history includes Arthritis in her mother; Cancer in her father; Dementia in her mother; Diabetes in her father; Heart disease in her father; Hypertension in her father; Stroke in her father.  ROS:   Review of Systems  Constitution: Negative for decreased appetite, fever and weight gain.  HENT: Negative for congestion, ear discharge, hoarse voice and sore throat.   Eyes: Negative for discharge, redness, vision loss in right eye and visual halos.  Cardiovascular: Negative for chest pain, dyspnea on exertion, leg swelling, orthopnea and palpitations.  Respiratory: Reports shortness of breath.  Negative for cough, hemoptysis and snoring.   Endocrine: Negative for heat intolerance and polyphagia.  Hematologic/Lymphatic: Negative for bleeding problem. Does not bruise/bleed easily.  Skin: Negative for flushing, nail changes, rash and suspicious lesions.  Musculoskeletal: Negative for arthritis, joint pain, muscle cramps, myalgias, neck pain and stiffness.    Gastrointestinal: Negative for abdominal pain, bowel incontinence, diarrhea and excessive appetite.  Genitourinary: Negative for decreased libido, genital sores and incomplete emptying.  Neurological: Negative for brief paralysis, focal weakness, headaches and loss of balance.  Psychiatric/Behavioral: Negative for altered mental status, depression and suicidal ideas.  Allergic/Immunologic: Negative for HIV exposure and persistent infections.    EKGs/Labs/Other Studies Reviewed:    The following studies were reviewed today:   EKG:  The ekg ordered today demonstrates sinus rhythm, heart rate 77 bpm, possible left atrial enlargement with RSR prime-incomplete right bundle branch block.  Recent Labs: 04/29/2019: ALT 20; BUN 14; Creatinine, Ser 0.77; Hemoglobin 14.3; Platelets 257.0; Potassium 4.4; Sodium 138  Recent Lipid Panel    Component Value Date/Time   CHOL 244 (H) 04/29/2019 1020   TRIG 148.0 04/29/2019 1020   HDL 57.40 04/29/2019 1020   CHOLHDL 4 04/29/2019 1020   VLDL 29.6 04/29/2019 1020   LDLCALC 157 (H) 04/29/2019 1020   LDLCALC 128 (H) 04/21/2017 1514   LDLDIRECT 164.5 11/21/2012 1157    Physical Exam:    VS:  BP 134/80 (BP Location: Right Arm, Patient Position: Sitting, Cuff Size: Normal)   Pulse 82   Ht 5' 4.75" (1.645 m)   Wt 162 lb (73.5 kg)   SpO2 97%   BMI 27.17 kg/m     Wt Readings from Last 3 Encounters:  05/02/19 162 lb (73.5 kg)  04/29/19 161 lb 2 oz (73.1 kg)  12/03/18 160 lb (72.6 kg)     GEN: Well nourished, well developed in no acute distress HEENT: Normal NECK: No JVD; No carotid bruits LYMPHATICS: No lymphadenopathy CARDIAC: S1S2 noted,RRR, no murmurs, rubs, gallops RESPIRATORY:  Clear to auscultation without rales, wheezing or rhonchi  ABDOMEN: Soft, non-tender, non-distended, +bowel sounds, no guarding. EXTREMITIES: No edema, No cyanosis, no clubbing MUSCULOSKELETAL:  No deformity  SKIN: Warm and dry NEUROLOGIC:  Alert and oriented x  3, non-focal PSYCHIATRIC:  Normal affect, good insight  ASSESSMENT:    1. Abnormal EKG   2. Nonspecific abnormal electrocardiogram (ECG) (EKG)   3. Mixed hyperlipidemia    PLAN:     Her EKG does show incomplete right bundle branch block with logical conduction suggestive of left atrial enlargement therefore with her shortness of breath I am going to order echocardiogram to assess for any structural abnormalities as well as for RV/LV function.  I review her lipid profile which was done at her PCP office which showed LDL at 157 which is an improvement from a year ago which was 165.  For now after discussing with the patient she prefer diet modification she will repeat this in 8 weeks at which time if needed statin medication will be started.  The patient is in agreement with the above plan. The patient left the office in stable condition.  The patient will follow up in 3 months or sooner if needed   Medication Adjustments/Labs and Tests Ordered: Current medicines are reviewed at length with the patient today.  Concerns regarding medicines are outlined above.  Orders Placed This Encounter  Procedures  . Lipid panel  . EKG 12-Lead  . ECHOCARDIOGRAM COMPLETE   No orders of the defined types were placed in this encounter.   Patient Instructions  Medication Instructions:  Your physician recommends that you continue on your current medications as directed. Please refer to the Current Medication list given to you today.  *If you need a refill on your cardiac medications before your next appointment, please call your pharmacy*   Lab Work: BEFORE YOUR NEXT OFFICE VISIT:  COME TO THE LAB A COUPLE DAYS PRIOR, FASTING, NOTHING TO EAT OR DRINK AFTER MIDNIGHT THE NIGHT BEFORE TO GET LABS DONE  If you have labs (blood work) drawn today and your tests are completely normal, you will receive your results only by: Marland Kitchen MyChart Message (if you have MyChart) OR . A paper copy in the mail If you  have any lab test that is abnormal or we need to change your treatment, we will call you to review the results.   Testing/Procedures: Your physician has requested that you have an echocardiogram. Echocardiography is a painless test that uses sound waves to create images of your heart. It provides your doctor with information about the size and shape of your heart and how well your heart's chambers and valves are working. This procedure takes approximately one hour. There are no restrictions for this procedure.     Follow-Up: At [facility], you and your health needs are our priority.  As part of our continuing mission to provide you with exceptional heart care, we have created designated Provider Care Teams.  These Care Teams include your primary Cardiologist (physician) and Advanced Practice Providers (APPs -  Physician Assistants and Nurse Practitioners) who all work together to provide you with the care you need, when you need it.  We recommend signing up for the patient portal called "MyChart".  Sign up information is provided on this After Visit Summary.  MyChart is used to connect with patients for Virtual Visits (Telemedicine).  Patients are able to view lab/test results, encounter notes, upcoming appointments, etc.  Non-urgent messages can be sent to your provider as well.   To learn more about what you can do with MyChart, go to [email or web address].    Your next appointment:   3 month(s)  The format for your next appointment:   In Person  Provider:   Berniece Salines, DO   Other Instructions  Echocardiogram An echocardiogram is a procedure that uses painless sound waves (ultrasound) to produce an image of the heart. Images from an echocardiogram can provide important information about:  Signs of coronary artery disease (CAD).  Aneurysm detection. An aneurysm is a weak or damaged part of an artery wall that bulges out from the normal force of blood pumping through the  body.  Heart size and shape. Changes in the size or shape of the heart can be associated with certain conditions, including heart failure, aneurysm, and CAD.  Heart muscle function.  Heart valve function.  Signs of a past heart attack.  Fluid buildup around the heart.  Thickening of the heart muscle.  A tumor or infectious growth around the heart valves. Tell a health care provider about:  Any allergies you have.  All medicines you are taking,  including vitamins, herbs, eye drops, creams, and over-the-counter medicines.  Any blood disorders you have.  Any surgeries you have had.  Any medical conditions you have.  Whether you are pregnant or may be pregnant. What are the risks? Generally, this is a safe procedure. However, problems may occur, including:  Allergic reaction to dye (contrast) that may be used during the procedure. What happens before the procedure? No specific preparation is needed. You may eat and drink normally. What happens during the procedure?   An IV tube may be inserted into one of your veins.  You may receive contrast through this tube. A contrast is an injection that improves the quality of the pictures from your heart.  A gel will be applied to your chest.  A wand-like tool (transducer) will be moved over your chest. The gel will help to transmit the sound waves from the transducer.  The sound waves will harmlessly bounce off of your heart to allow the heart images to be captured in real-time motion. The images will be recorded on a computer. The procedure may vary among health care providers and hospitals. What happens after the procedure?  You may return to your normal, everyday life, including diet, activities, and medicines, unless your health care provider tells you not to do that. Summary  An echocardiogram is a procedure that uses painless sound waves (ultrasound) to produce an image of the heart.  Images from an echocardiogram can  provide important information about the size and shape of your heart, heart muscle function, heart valve function, and fluid buildup around your heart.  You do not need to do anything to prepare before this procedure. You may eat and drink normally.  After the echocardiogram is completed, you may return to your normal, everyday life, unless your health care provider tells you not to do that. This information is not intended to replace advice given to you by your health care provider. Make sure you discuss any questions you have with your health care provider. Document Revised: 05/17/2018 Document Reviewed: 02/27/2016 Elsevier Patient Education  [place].      Adopting a Healthy Lifestyle.  Know what a healthy weight is for you (roughly BMI <25) and aim to maintain this   Aim for 7+ servings of fruits and vegetables daily   65-80+ fluid ounces of water or unsweet tea for healthy kidneys   Limit to max 1 drink of alcohol per day; avoid smoking/tobacco   Limit animal fats in diet for cholesterol and heart health - choose grass fed whenever available   Avoid highly processed foods, and foods high in saturated/trans fats   Aim for low stress - take time to unwind and care for your mental health   Aim for 150 min of moderate intensity exercise weekly for heart health, and weights twice weekly for bone health   Aim for 7-9 hours of sleep daily   When it comes to diets, agreement about the perfect plan isnt easy to find, even among the experts. Experts at the [employer] developed an idea known as the Healthy Eating Plate. Just imagine a plate divided into logical, healthy portions.   The emphasis is on diet quality:   Load up on vegetables and fruits - one-half of your plate: Aim for color and variety, and remember that potatoes dont count.   Go for whole grains - one-quarter of your plate: Whole wheat, barley, wheat berries, quinoa, oats, brown rice,  and foods  made with them. If you want pasta, go with whole wheat pasta.   Protein power - one-quarter of your plate: Fish, chicken, beans, and nuts are all healthy, versatile protein sources. Limit red meat.   The diet, however, does go beyond the plate, offering a few other suggestions.   Use healthy plant oils, such as olive, canola, soy, corn, sunflower and peanut. Check the labels, and avoid partially hydrogenated oil, which have unhealthy trans fats.   If youre thirsty, drink water. Coffee and tea are good in moderation, but skip sugary drinks and limit milk and dairy products to one or two daily servings.   The type of carbohydrate in the diet is more important than the amount. Some sources of carbohydrates, such as vegetables, fruits, whole grains, and beans-are healthier than others.   Finally, stay active  Signed, Berniece Salines, DO  05/02/2019 3:21 PM    [facility]

## 2019-05-08 ENCOUNTER — Ambulatory Visit (HOSPITAL_BASED_OUTPATIENT_CLINIC_OR_DEPARTMENT_OTHER)
Admission: RE | Admit: 2019-05-08 | Discharge: 2019-05-08 | Disposition: A | Discharge status: Home / Self Care | Payer: PPO | Source: Ambulatory Visit | Attending: Cardiology | Admitting: Cardiology

## 2019-05-08 ENCOUNTER — Other Ambulatory Visit: Payer: Self-pay

## 2019-05-08 DIAGNOSIS — R9431 Abnormal electrocardiogram [ECG] [EKG]: Secondary | ICD-10-CM | POA: Insufficient documentation

## 2019-05-08 NOTE — Progress Notes (Signed)
  Echocardiogram 2D Echocardiogram has been performed.  Olivia [name] 05/08/2019, 10:52 AM

## 2019-07-03 ENCOUNTER — Encounter: Payer: Self-pay | Admitting: Cardiology

## 2019-08-01 DIAGNOSIS — L820 Inflamed seborrheic keratosis: Secondary | ICD-10-CM | POA: Diagnosis not present

## 2019-08-01 DIAGNOSIS — L821 Other seborrheic keratosis: Secondary | ICD-10-CM | POA: Diagnosis not present

## 2019-08-08 ENCOUNTER — Ambulatory Visit: Payer: PPO | Admitting: Cardiology

## 2019-08-20 ENCOUNTER — Other Ambulatory Visit: Payer: Self-pay | Admitting: Emergency Medicine

## 2019-08-20 DIAGNOSIS — R9431 Abnormal electrocardiogram [ECG] [EKG]: Secondary | ICD-10-CM

## 2019-08-21 DIAGNOSIS — R9431 Abnormal electrocardiogram [ECG] [EKG]: Secondary | ICD-10-CM | POA: Diagnosis not present

## 2019-08-22 ENCOUNTER — Telehealth: Payer: Self-pay

## 2019-08-22 LAB — LIPID PANEL
Chol/HDL Ratio: 4.4 ratio (ref 0.0–4.4)
Cholesterol, Total: 253 mg/dL — ABNORMAL HIGH (ref 100–199)
HDL: 57 mg/dL (ref >39)
LDL Chol Calc (NIH): 165 mg/dL — ABNORMAL HIGH (ref 0–99)
Triglycerides: 173 mg/dL — ABNORMAL HIGH (ref 0–149)
VLDL Cholesterol Cal: 31 mg/dL (ref 5–40)

## 2019-08-22 NOTE — Telephone Encounter (Signed)
-----   Message from Berniece Salines, DO sent at 08/22/2019  9:07 AM EDT ----- Your LDL is 165, total cholesterol is 253 triglyceride is 173 these numbers are really high I really would like [place] to try a statin medication just to help you bring this number down and lower your risk for cardiovascular adverse events.  If she is agreeable please start her on Crestor 10 mg daily.  If not, I can discuss it with her again at her visit on July 23

## 2019-08-22 NOTE — Telephone Encounter (Signed)
Left message on patients voicemail to please return our call.   

## 2019-08-30 ENCOUNTER — Encounter: Payer: Self-pay | Admitting: Cardiology

## 2019-08-30 ENCOUNTER — Other Ambulatory Visit: Payer: Self-pay

## 2019-08-30 ENCOUNTER — Ambulatory Visit: Payer: PPO | Admitting: Cardiology

## 2019-08-30 VITALS — BP 120/76 | HR 84 | Ht 64.0 in | Wt 159.0 lb

## 2019-08-30 DIAGNOSIS — E782 Mixed hyperlipidemia: Secondary | ICD-10-CM | POA: Diagnosis not present

## 2019-08-30 DIAGNOSIS — R7303 Prediabetes: Secondary | ICD-10-CM | POA: Diagnosis not present

## 2019-08-30 NOTE — Progress Notes (Signed)
Cardiology Office Note:    Date:  08/30/2019   ID:  Olivia [name], Olivia [name] 02-05-1956, MRN [id number]  PCP:  Ann Held, DO  Cardiologist:  No primary care provider on file.  Electrophysiologist:  None   Referring MD: Carollee Herter, Alferd Apa, *   " I am doing well"  History of Present Illness:    Olivia [name] is a 64 y.o. female with a hx of prediabetes, vitamin D deficiency, hyperlipidemia not on statins presents today for follow-up visit. I last saw the patient on May 02, 2019 at that time, there was concern for abnormal EKG which was a right bundle branch block.  And evidence of left atrial margin.  We therefore ordered echocardiogram.  In addition her LDL was 157 we discussed possible start of statin medications for the patient at that time did not want to pursue this treatment.  Today she denies any chest pain, shortness of breath, nausea, vomiting.   History reviewed. No pertinent past medical history.  History reviewed. No pertinent surgical history.  Current Medications: No outpatient medications have been marked as taking for the 08/30/19 encounter (Office Visit) with Berniece Salines, DO.     Allergies:   Patient has no known allergies.   Social History   Socioeconomic History  . Marital status: Married    Spouse name: Not on file  . Number of children: 2  . Years of education: Not on file  . Highest education level: Not on file  Occupational History  . 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 Smoking status: Never Smoker  . Smokeless tobacco: Never Used  Substance and Sexual Activity  . Alcohol use: Yes    Alcohol/week: 2.0 standard drinks    Types: 2 Standard drinks or equivalent per week  . Drug use: No  . Sexual activity: Yes    Partners: Male  Other Topics Concern  . Not on file  Social History Narrative   Exercise---  3-4 x a week      Social Determinants of Health   Financial Resource  Strain:   . Difficulty of Paying Living Expenses:   Food Insecurity:   . Worried About Charity fundraiser in the Last Year:   . Arboriculturist in the Last Year:   Transportation Needs:   . Film/video editor (Medical):   Marland Kitchen Lack of Transportation (Non-Medical):   Physical Activity:   . Days of Exercise per Week:   . Minutes of Exercise per Session:   Stress:   . Feeling of Stress :   Social Connections:   . Frequency of Communication with Friends and Family:   . Frequency of Social Gatherings with Friends and Family:   . Attends Religious Services:   . Active Member of Clubs or Organizations:   . Attends Archivist Meetings:   Marland Kitchen Marital Status:      Family History: The patient's family history includes Arthritis in her mother; Cancer in her father; Dementia in her mother; Diabetes in her father; Heart disease in her father; Hypertension in her father; Stroke in her father.  ROS:   Review of Systems  Constitution: Negative for decreased appetite, fever and weight gain.  HENT: Negative for congestion, ear discharge, hoarse voice and sore throat.   Eyes: Negative for discharge, redness, vision loss in right eye and visual halos.  Cardiovascular: Negative for chest pain, dyspnea  on exertion, leg swelling, orthopnea and palpitations.  Respiratory: Negative for cough, hemoptysis, shortness of breath and snoring.   Endocrine: Negative for heat intolerance and polyphagia.  Hematologic/Lymphatic: Negative for bleeding problem. Does not bruise/bleed easily.  Skin: Negative for flushing, nail changes, rash and suspicious lesions.  Musculoskeletal: Negative for arthritis, joint pain, muscle cramps, myalgias, neck pain and stiffness.  Gastrointestinal: Negative for abdominal pain, bowel incontinence, diarrhea and excessive appetite.  Genitourinary: Negative for decreased libido, genital sores and incomplete emptying.  Neurological: Negative for brief paralysis, focal weakness,  headaches and loss of balance.  Psychiatric/Behavioral: Negative for altered mental status, depression and suicidal ideas.  Allergic/Immunologic: Negative for HIV exposure and persistent infections.    EKGs/Labs/Other Studies Reviewed:    The following studies were reviewed today:   EKG:  The ekg ordered today demonstrates   Recent Labs: 04/29/2019: ALT 20; BUN 14; Creatinine, Ser 0.77; Hemoglobin 14.3; Platelets 257.0; Potassium 4.4; Sodium 138  Recent Lipid Panel    Component Value Date/Time   CHOL 253 (H) 08/21/2019 0903   TRIG 173 (H) 08/21/2019 0903   HDL 57 08/21/2019 0903   CHOLHDL 4.4 08/21/2019 0903   CHOLHDL 4 04/29/2019 1020   VLDL 29.6 04/29/2019 1020   LDLCALC 165 (H) 08/21/2019 0903   LDLCALC 128 (H) 04/21/2017 1514   LDLDIRECT 164.5 11/21/2012 1157    Physical Exam:    VS:  BP 120/76   Pulse 84   Ht 5\' 4"  (1.626 m)   Wt 159 lb (72.1 kg)   SpO2 97%   BMI 27.29 kg/m     Wt Readings from Last 3 Encounters:  08/30/19 159 lb (72.1 kg)  05/02/19 162 lb (73.5 kg)  04/29/19 161 lb 2 oz (73.1 kg)     GEN: Well nourished, well developed in no acute distress HEENT: Normal NECK: No JVD; No carotid bruits LYMPHATICS: No lymphadenopathy CARDIAC: S1S2 noted,RRR, no murmurs, rubs, gallops RESPIRATORY:  Clear to auscultation without rales, wheezing or rhonchi  ABDOMEN: Soft, non-tender, non-distended, +bowel sounds, no guarding. EXTREMITIES: No edema, No cyanosis, no clubbing MUSCULOSKELETAL:  No deformity  SKIN: Warm and dry NEUROLOGIC:  Alert and oriented x 3, non-focal PSYCHIATRIC:  Normal affect, good insight  ASSESSMENT:    1. Mixed hyperlipidemia   2. Prediabetes    PLAN:     She is doing well from a cardiovascular standpoint she has no symptoms today she tells me that she is previously about starting statin.  At this time we have discussed and she would like to get a coronary calcium scoring.  I do not think this is unreasonable.  Therefore I am  going to order this testing for the patient.  For now she wants to wait and we can reconsider the use of statin.  The patient is in agreement with the above plan. The patient left the office in stable condition.  The patient will follow up in 6 months or sooner if needed.  Medication Adjustments/Labs and Tests Ordered: Current medicines are reviewed at length with the patient today.  Concerns regarding medicines are outlined above.  Orders Placed This Encounter  Procedures  . CT CARDIAC SCORING   No orders of the defined types were placed in this encounter.   Patient Instructions  Medication Instructions:  Your physician recommends that you continue on your current medications as directed. Please refer to the Current Medication list given to you today.  *If you need a refill on your cardiac medications before your next appointment,  please call your pharmacy*   Lab Work: None If you have labs (blood work) drawn today and your tests are completely normal, you will receive your results only by: Marland Kitchen MyChart Message (if you have MyChart) OR . A paper copy in the mail If you have any lab test that is abnormal or we need to change your treatment, we will call you to review the results.   Testing/Procedures: We have put in an order for you to have a CT calcium score completed. They will call you to schedule this appointment.    Follow-Up: At [facility], you and your health needs are our priority.  As part of our continuing mission to provide you with exceptional heart care, we have created designated Provider Care Teams.  These Care Teams include your primary Cardiologist (physician) and Advanced Practice Providers (APPs -  Physician Assistants and Nurse Practitioners) who all work together to provide you with the care you need, when you need it.  We recommend signing up for the patient portal called "MyChart".  Sign up information is provided on this After Visit Summary.  MyChart is  used to connect with patients for Virtual Visits (Telemedicine).  Patients are able to view lab/test results, encounter notes, upcoming appointments, etc.  Non-urgent messages can be sent to your provider as well.   To learn more about what you can do with MyChart, go to [email or web address].    Your next appointment:   6 month(s)  The format for your next appointment:   In Person  Provider:   Berniece Salines, DO   Other Instructions      Adopting a Healthy Lifestyle.  Know what a healthy weight is for you (roughly BMI <25) and aim to maintain this   Aim for 7+ servings of fruits and vegetables daily   65-80+ fluid ounces of water or unsweet tea for healthy kidneys   Limit to max 1 drink of alcohol per day; avoid smoking/tobacco   Limit animal fats in diet for cholesterol and heart health - choose grass fed whenever available   Avoid highly processed foods, and foods high in saturated/trans fats   Aim for low stress - take time to unwind and care for your mental health   Aim for 150 min of moderate intensity exercise weekly for heart health, and weights twice weekly for bone health   Aim for 7-9 hours of sleep daily   When it comes to diets, agreement about the perfect plan isnt easy to find, even among the experts. Experts at the [place] developed an idea known as the Healthy Eating Plate. Just imagine a plate divided into logical, healthy portions.   The emphasis is on diet quality:   Load up on vegetables and fruits - one-half of your plate: Aim for color and variety, and remember that potatoes dont count.   Go for whole grains - one-quarter of your plate: Whole wheat, barley, wheat berries, quinoa, oats, brown rice, and foods made with them. If you want pasta, go with whole wheat pasta.   Protein power - one-quarter of your plate: Fish, chicken, beans, and nuts are all healthy, versatile protein sources. Limit red meat.   The diet,  however, does go beyond the plate, offering a few other suggestions.   Use healthy plant oils, such as olive, canola, soy, corn, sunflower and peanut. Check the labels, and avoid partially hydrogenated oil, which have unhealthy trans fats.   If youre thirsty, drink water.  Coffee and tea are good in moderation, but skip sugary drinks and limit milk and dairy products to one or two daily servings.   The type of carbohydrate in the diet is more important than the amount. Some sources of carbohydrates, such as vegetables, fruits, whole grains, and beans-are healthier than others.   Finally, stay active  Signed, Berniece Salines, DO  08/30/2019 9:54 AM    [place]

## 2019-08-30 NOTE — Patient Instructions (Signed)
Medication Instructions:  Your physician recommends that you continue on your current medications as directed. Please refer to the Current Medication list given to you today.  *If you need a refill on your cardiac medications before your next appointment, please call your pharmacy*   Lab Work: None If you have labs (blood work) drawn today and your tests are completely normal, you will receive your results only by: Marland Kitchen MyChart Message (if you have MyChart) OR . A paper copy in the mail If you have any lab test that is abnormal or we need to change your treatment, we will call you to review the results.   Testing/Procedures: We have put in an order for you to have a CT calcium score completed. They will call you to schedule this appointment.    Follow-Up: At [facility], you and your health needs are our priority.  As part of our continuing mission to provide you with exceptional heart care, we have created designated Provider Care Teams.  These Care Teams include your primary Cardiologist (physician) and Advanced Practice Providers (APPs -  Physician Assistants and Nurse Practitioners) who all work together to provide you with the care you need, when you need it.  We recommend signing up for the patient portal called "MyChart".  Sign up information is provided on this After Visit Summary.  MyChart is used to connect with patients for Virtual Visits (Telemedicine).  Patients are able to view lab/test results, encounter notes, upcoming appointments, etc.  Non-urgent messages can be sent to your provider as well.   To learn more about what you can do with MyChart, go to [email or web address].    Your next appointment:   6 month(s)  The format for your next appointment:   In Person  Provider:   Berniece Salines, DO   Other Instructions

## 2019-09-19 ENCOUNTER — Other Ambulatory Visit: Payer: Self-pay

## 2019-09-19 ENCOUNTER — Ambulatory Visit (INDEPENDENT_AMBULATORY_CARE_PROVIDER_SITE_OTHER)
Admission: RE | Admit: 2019-09-19 | Discharge: 2019-09-19 | Disposition: A | Discharge status: Home / Self Care | Payer: Self-pay | Source: Ambulatory Visit | Attending: Cardiology | Admitting: Cardiology

## 2019-09-19 DIAGNOSIS — E782 Mixed hyperlipidemia: Secondary | ICD-10-CM

## 2019-09-23 ENCOUNTER — Telehealth: Payer: Self-pay

## 2019-09-23 NOTE — Telephone Encounter (Signed)
Olivia [name] is returning [employer], but states she is about to go into a meeting so she will just look over the results on MyChart.

## 2019-09-23 NOTE — Telephone Encounter (Signed)
Left message on patients voicemail to please return our call.   

## 2019-09-23 NOTE — Telephone Encounter (Signed)
-----   Message from Berniece Salines, DO sent at 09/22/2019  9:54 PM EDT ----- Doristine Devoid news!  Calcium scoring 0. This means your coronary artery does not have any plaque build up.

## 2019-10-17 ENCOUNTER — Encounter (HOSPITAL_BASED_OUTPATIENT_CLINIC_OR_DEPARTMENT_OTHER): Payer: Self-pay

## 2019-10-17 ENCOUNTER — Emergency Department (HOSPITAL_BASED_OUTPATIENT_CLINIC_OR_DEPARTMENT_OTHER): Payer: PPO

## 2019-10-17 ENCOUNTER — Emergency Department (HOSPITAL_BASED_OUTPATIENT_CLINIC_OR_DEPARTMENT_OTHER)
Admission: EM | Admit: 2019-10-17 | Discharge: 2019-10-18 | Disposition: A | Discharge status: Home / Self Care | Payer: PPO | Attending: Emergency Medicine | Admitting: Emergency Medicine

## 2019-10-17 ENCOUNTER — Other Ambulatory Visit: Payer: Self-pay

## 2019-10-17 ENCOUNTER — Emergency Department (HOSPITAL_COMMUNITY): Payer: PPO

## 2019-10-17 DIAGNOSIS — R410 Disorientation, unspecified: Secondary | ICD-10-CM | POA: Insufficient documentation

## 2019-10-17 DIAGNOSIS — R4182 Altered mental status, unspecified: Secondary | ICD-10-CM | POA: Insufficient documentation

## 2019-10-17 DIAGNOSIS — E237 Disorder of pituitary gland, unspecified: Secondary | ICD-10-CM | POA: Diagnosis not present

## 2019-10-17 DIAGNOSIS — Z79899 Other long term (current) drug therapy: Secondary | ICD-10-CM | POA: Diagnosis not present

## 2019-10-17 DIAGNOSIS — I7090 Unspecified atherosclerosis: Secondary | ICD-10-CM | POA: Diagnosis not present

## 2019-10-17 LAB — DIFFERENTIAL
Abs Immature Granulocytes: 0.02 10*3/uL (ref 0.00–0.07)
Basophils Absolute: 0.1 10*3/uL (ref 0.0–0.1)
Basophils Relative: 1 %
Eosinophils Absolute: 0.2 10*3/uL (ref 0.0–0.5)
Eosinophils Relative: 2 %
Immature Granulocytes: 0 %
Lymphocytes Relative: 24 %
Lymphs Abs: 2 10*3/uL (ref 0.7–4.0)
Monocytes Absolute: 0.6 10*3/uL (ref 0.1–1.0)
Monocytes Relative: 7 %
Neutro Abs: 5.3 10*3/uL (ref 1.7–7.7)
Neutrophils Relative %: 66 %

## 2019-10-17 LAB — URINALYSIS, ROUTINE W REFLEX MICROSCOPIC
Bilirubin Urine: NEGATIVE
Glucose, UA: NEGATIVE mg/dL
Hgb urine dipstick: NEGATIVE
Ketones, ur: NEGATIVE mg/dL
Leukocytes,Ua: NEGATIVE
Nitrite: NEGATIVE
Protein, ur: NEGATIVE mg/dL
Specific Gravity, Urine: <1.005 — ABNORMAL LOW (ref 1.005–1.030)
pH: 5.5 (ref 5.0–8.0)

## 2019-10-17 LAB — PROTIME-INR
INR: 1 (ref 0.8–1.2)
Prothrombin Time: 12.6 seconds (ref 11.4–15.2)

## 2019-10-17 LAB — COMPREHENSIVE METABOLIC PANEL
ALT: 19 U/L (ref 0–44)
AST: 22 U/L (ref 15–41)
Albumin: 4.4 g/dL (ref 3.5–5.0)
Alkaline Phosphatase: 75 U/L (ref 38–126)
Anion gap: 10 (ref 5–15)
BUN: 12 mg/dL (ref 8–23)
CO2: 25 mmol/L (ref 22–32)
Calcium: 9.2 mg/dL (ref 8.9–10.3)
Chloride: 100 mmol/L (ref 98–111)
Creatinine, Ser: 0.81 mg/dL (ref 0.44–1.00)
GFR calc Af Amer: >60 mL/min (ref >60)
GFR calc non Af Amer: >60 mL/min (ref >60)
Glucose, Bld: 116 mg/dL — ABNORMAL HIGH (ref 70–99)
Potassium: 3.5 mmol/L (ref 3.5–5.1)
Sodium: 135 mmol/L (ref 135–145)
Total Bilirubin: 0.5 mg/dL (ref 0.3–1.2)
Total Protein: 7.7 g/dL (ref 6.5–8.1)

## 2019-10-17 LAB — RAPID URINE DRUG SCREEN, HOSP PERFORMED
Amphetamines: NOT DETECTED
Barbiturates: NOT DETECTED
Benzodiazepines: NOT DETECTED
Cocaine: NOT DETECTED
Opiates: NOT DETECTED
Tetrahydrocannabinol: NOT DETECTED

## 2019-10-17 LAB — CBC
HCT: 41.2 % (ref 36.0–46.0)
Hemoglobin: 13.9 g/dL (ref 12.0–15.0)
MCH: 29.8 pg (ref 26.0–34.0)
MCHC: 33.7 g/dL (ref 30.0–36.0)
MCV: 88.2 fL (ref 80.0–100.0)
Platelets: 260 10*3/uL (ref 150–400)
RBC: 4.67 MIL/uL (ref 3.87–5.11)
RDW: 12.1 % (ref 11.5–15.5)
WBC: 8.1 10*3/uL (ref 4.0–10.5)
nRBC: 0 % (ref 0.0–0.2)

## 2019-10-17 LAB — APTT: aPTT: 28 seconds (ref 24–36)

## 2019-10-17 LAB — CBG MONITORING, ED: Glucose-Capillary: 136 mg/dL — ABNORMAL HIGH (ref 70–99)

## 2019-10-17 LAB — ETHANOL: Alcohol, Ethyl (B): <10 mg/dL (ref <10)

## 2019-10-17 MED ORDER — LORAZEPAM 2 MG/ML IJ SOLN
1.0000 mg | Freq: Once | INTRAMUSCULAR | Status: AC
  Administered 2019-10-17: 1 mg via INTRAVENOUS
  Filled 2019-10-17: qty 1

## 2019-10-17 NOTE — ED Provider Notes (Signed)
[place] EMERGENCY DEPARTMENT Provider Note   CSN: [id number] Arrival date & time: 10/17/19  1322     History Chief Complaint  Patient presents with  . Altered Mental Status    Olivia [name] is a 64 y.o. female.  64 yo F with a chief complaints of for an episode of confusion.  The patient had come downstairs and told her husband that she felt confused.  She ended up repeating herself and asking the same question over and over again.  About the fourth time she did it he decided to bring her to the hospital.  She gets questioning something on her schedule about jury duty and then did not know that her son was coming to visit today.  She denies head injury denies loss consciousness denies headache or neck pain.  Denies cough congestion or fever.  She felt that she had an upset stomach a couple days ago but that is resolved.  Otherwise been eating and drinking normally.  Denies dark stool or blood in her stool.  Denies recent change in medication.  The history is provided by the patient and the spouse.  Altered Mental Status Associated symptoms: no fever, no headaches, no nausea, no palpitations and no vomiting   Illness Severity:  Moderate Onset quality:  Gradual Duration:  15 minutes Timing:  Constant Progression:  Resolved Chronicity:  New Associated symptoms: no chest pain, no congestion, no fever, no headaches, no myalgias, no nausea, no rhinorrhea, no shortness of breath, no vomiting and no wheezing        History reviewed. No pertinent past medical history.  Patient Active Problem List   Diagnosis Date Noted  . Nonspecific abnormal electrocardiogram (ECG) (EKG) 05/02/2019  . Mixed hyperlipidemia 05/02/2019  . 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 Non-recurrent acute serous otitis media of left ear 04/23/2018  . Right wrist injury, subsequent  encounter 05/14/2017  . Preventative health care 04/21/2017  . Migraine without aura and without status migrainosus, not intractable 10/17/2016    History reviewed. No pertinent surgical history.   OB History   No obstetric history on file.     Family History  Problem Relation Age of Onset  . Arthritis Mother   . Dementia Mother   . Heart disease Father   . Stroke Father   . Hypertension Father   . Diabetes Father   . Cancer Father        liver    Social History   Tobacco Use  . Smoking status: Never Smoker  . Smokeless tobacco: Never Used  Substance Use Topics  . Alcohol use: Yes    Alcohol/week: 2.0 standard drinks    Types: 2 Standard drinks or equivalent per week  . Drug use: No    Home Medications Prior to Admission medications   Medication Sig Start Date End Date Taking? Authorizing Provider  aspirin-acetaminophen-caffeine (EXCEDRIN MIGRAINE) [id number] MG per tablet Take 1 tablet by mouth every 6 (six) hours as needed.    [provider]  naproxen sodium (ALEVE) 220 MG tablet Take 220 mg by mouth.    [provider]  Vitamin D, Ergocalciferol, (DRISDOL) 1.25 MG (50000 UNIT) CAPS capsule Take 1 capsule (50,000 Units total) by mouth every 7 (seven) days. 04/29/19   Ann Held, DO    Allergies    Patient has no known allergies.  Review of Systems  Review of Systems  Constitutional: Negative for chills and fever.  HENT: Negative for congestion and rhinorrhea.   Eyes: Negative for redness and visual disturbance.  Respiratory: Negative for shortness of breath and wheezing.   Cardiovascular: Negative for chest pain and palpitations.  Gastrointestinal: Negative for nausea and vomiting.  Genitourinary: Negative for dysuria and urgency.  Musculoskeletal: Negative for arthralgias and myalgias.  Skin: Negative for pallor and wound.  Neurological: Negative for dizziness and headaches.       Transient confusion    Physical  Exam Updated Vital Signs BP 136/73 (BP Location: Left Arm)   Pulse 80   Temp 98.7 F (37.1 C) (Oral)   Resp 16   Ht 5\' 4"  (1.626 m)   Wt 71.7 kg   SpO2 100%   BMI 27.12 kg/m   Physical Exam Vitals and nursing note reviewed.  Constitutional:      General: She is not in acute distress.    Appearance: She is well-developed. She is not diaphoretic.  HENT:     Head: Normocephalic and atraumatic.  Eyes:     Pupils: Pupils are equal, round, and reactive to light.  Cardiovascular:     Rate and Rhythm: Normal rate and regular rhythm.     Heart sounds: No murmur heard.  No friction rub. No gallop.   Pulmonary:     Effort: Pulmonary effort is normal.     Breath sounds: No wheezing or rales.  Abdominal:     General: There is no distension.     Palpations: Abdomen is soft.     Tenderness: There is no abdominal tenderness.  Musculoskeletal:        General: No tenderness.     Cervical back: Normal range of motion and neck supple.  Skin:    General: Skin is warm and dry.  Neurological:     Mental Status: She is alert and oriented to person, place, and time.     Cranial Nerves: Cranial nerves are intact.     Sensory: Sensation is intact.     Motor: Motor function is intact.     Coordination: Coordination is intact.     Gait: Gait is intact.  Psychiatric:        Behavior: Behavior normal.     ED Results / Procedures / Treatments   Labs (all labs ordered are listed, but only abnormal results are displayed) Labs Reviewed  COMPREHENSIVE METABOLIC PANEL - Abnormal; Notable for the following components:      Result Value   Glucose, Bld 116 (*)    All other components within normal limits  URINALYSIS, ROUTINE W REFLEX MICROSCOPIC - Abnormal; Notable for the following components:   Specific Gravity, Urine <1.005 (*)    All other components within normal limits  CBG MONITORING, ED - Abnormal; Notable for the following components:   Glucose-Capillary 136 (*)    All other  components within normal limits  ETHANOL  PROTIME-INR  APTT  CBC  DIFFERENTIAL  RAPID URINE DRUG SCREEN, HOSP PERFORMED    EKG EKG Interpretation  Date/Time:  Thursday October 17 2019 13:30:30 EDT Ventricular Rate:  99 PR Interval:  170 QRS Duration: 90 QT Interval:  344 QTC Calculation: 441 R Axis:   35 Text Interpretation: Sinus rhythm with Fusion complexes Biatrial enlargement Low voltage QRS RSR' or QR pattern in V1 suggests right ventricular conduction delay Abnormal ECG No old tracing to compare Confirmed by Deno Etienne [phone]) on 10/17/2019 1:38:53 PM   Radiology CT HEAD  WO CONTRAST  Result Date: 10/17/2019 CLINICAL DATA:  Confusion, memory problems EXAM: CT HEAD WITHOUT CONTRAST TECHNIQUE: Contiguous axial images were obtained from the base of the skull through the vertex without intravenous contrast. COMPARISON:  None. FINDINGS: Brain: There is no acute intracranial hemorrhage, mass effect, or edema. Gray-white differentiation is preserved. There is no extra-axial fluid collection. Ventricles and sulci are within normal limits in size and configuration. Vascular: There is atherosclerotic calcification at the skull base. Skull: Calvarium is unremarkable. Sinuses/Orbits: No acute finding. Other: None. IMPRESSION: No acute intracranial abnormality. Electronically Signed   By: Macy Mis M.D.   On: 10/17/2019 14:29    Procedures Procedures (including critical care time)  Medications Ordered in ED Medications - No data to display  ED Course  I have reviewed the triage vital signs and the nursing notes.  Pertinent labs & imaging results that were available during my care of the patient were reviewed by me and considered in my medical decision making (see chart for details).    MDM Rules/Calculators/A&P                          64 yo F with a chief complaints of confusion.  This is resolved.  Lasted for about 15 minutes or so.  No weakness with this.  Possible transient  global amnesia versus TIA.  Benign neurologic exam now.  Will obtain a CT of the head lab work.  Discussed with neurology, Dr. Rory Percy.  Recommended MRI of the brain.  If negative he felt could follow-up as an outpatient with neurology.  Discussed with Dr. Melina Copa accepts in transfer.  The patients results and plan were reviewed and discussed.   Any x-rays performed were independently reviewed by myself.   Differential diagnosis were considered with the presenting HPI.  Medications - No data to display  Vitals:   10/17/19 1335 10/17/19 1336 10/17/19 1357 10/17/19 1506  BP: (!) 148/74  (!) 143/77 136/73  Pulse: 99  90 80  Resp: 18  18 16   Temp: 98.7 F (37.1 C)     TempSrc: Oral     SpO2: 97%  96% 100%  Weight:  71.7 kg    Height:  5\' 4"  (1.626 m)      Final diagnoses:  Confusion      Final Clinical Impression(s) / ED Diagnoses Final diagnoses:  Confusion    Rx / DC Orders ED Discharge Orders         Ordered    Ambulatory referral to Neurology        10/17/19 [place], [place], DO 10/17/19 1508

## 2019-10-17 NOTE — ED Notes (Signed)
Patient transported to CT via stretcher, sr x 2 up  

## 2019-10-17 NOTE — ED Notes (Signed)
ED Provider at bedside. 

## 2019-10-17 NOTE — ED Notes (Signed)
Ambulated to restroom with very min assistance, gait very steady, GCS @ 15, voices no complaints

## 2019-10-17 NOTE — ED Notes (Signed)
Phone report given to both Agricultural consultant at [employer], also spoke with Summer in MRI department of pt being tx [facility] for [facility] examination

## 2019-10-17 NOTE — Discharge Instructions (Addendum)
Your work-up in the emergency department was reassuring.  We recommend close follow-up with your primary care doctor and outpatient follow-up with neurology.

## 2019-10-17 NOTE — ED Notes (Signed)
Safety measures in place, husband at bedside, sr x 2 up, call bell within reach, bed in lowest position, pt remains NPO until further notice

## 2019-10-17 NOTE — ED Notes (Signed)
Pt and husband instructed on where to go at [place], opportunity for questions provided, IV saline locked and secured with Kerlix, paperwork given to husband, instructed to remain NPO and to go straight to the [facility] ED.

## 2019-10-17 NOTE — ED Notes (Signed)
Placed on cont cardiac monitoring, cont POX monitoring with int NBP

## 2019-10-17 NOTE — ED Triage Notes (Addendum)
Pt c/o "feeling confused"-states sx started when she was cleaning the bathroom-husband states pt came downstairs ~1215p and had repetitive ?s-was confused about something on her schedule and that son was visiting today-pt does not recall conversation with husband-pt states she does recall eating sandwich and she did correct husband that son is coming from [place] not [place] as he stated during triage pt is A/O-NAD-steady gait

## 2019-10-17 NOTE — ED Notes (Signed)
Was cleaning around the house approx 1230hrs today, began performing reoperative questions, feeling confused.

## 2019-10-17 NOTE — ED Notes (Signed)
Pt informed that urine spec has been ordered

## 2019-10-18 ENCOUNTER — Encounter: Payer: Self-pay | Admitting: Neurology

## 2019-10-18 ENCOUNTER — Encounter: Payer: Self-pay | Admitting: Family Medicine

## 2019-10-18 NOTE — ED Provider Notes (Signed)
13:54 AM 64 year old female presents in transfer from [facility] for MRI to further assess an episode of confusion which occurred in the AM.  Patient has not had any recurrent confusion.  Presently complains of feeling tired and hungry.  Does state she was cleaning with bleach around the time this episode occurred and she is unsure if this may have contributed.   Her MRI has been reviewed and does not show any emergent finding.  Prior recommendations from Dr. Rory Percy of Neurology are for continued outpatient Neurology follow up. Will proceed with referral. In the interim, patient advised to follow up with her PCP. Return precautions discussed and provided. Patient discharged in stable condition with no unaddressed concerns.   CT HEAD WO CONTRAST  Result Date: 10/17/2019 CLINICAL DATA:  Confusion, memory problems EXAM: CT HEAD WITHOUT CONTRAST TECHNIQUE: Contiguous axial images were obtained from the base of the skull through the vertex without intravenous contrast. COMPARISON:  None. FINDINGS: Brain: There is no acute intracranial hemorrhage, mass effect, or edema. Gray-white differentiation is preserved. There is no extra-axial fluid collection. Ventricles and sulci are within normal limits in size and configuration. Vascular: There is atherosclerotic calcification at the skull base. Skull: Calvarium is unremarkable. Sinuses/Orbits: No acute finding. Other: None. IMPRESSION: No acute intracranial abnormality. Electronically Signed   By: Macy Mis M.D.   On: 10/17/2019 14:29   MR BRAIN WO CONTRAST  Result Date: 10/17/2019 CLINICAL DATA:  64 year old female with confusion, TIA. EXAM: MRI HEAD WITHOUT CONTRAST TECHNIQUE: Multiplanar, multiecho pulse sequences of the brain and surrounding structures were obtained without intravenous contrast. COMPARISON:  Head CT 1414 hours today. FINDINGS: Brain: No convincing diffusion restriction. No midline shift, mass effect, evidence of mass lesion, ventriculomegaly,  extra-axial collection or acute intracranial hemorrhage. Cervicomedullary junction within normal limits. Largely normal for age gray and white matter signal throughout the brain. Minimal nonspecific white matter T2 and FLAIR hyperintensity. No cortical encephalomalacia. No chronic cerebral blood products. Deep gray nuclei, brainstem, cerebellum, and mesial temporal lobes appear normal for age. There is a subtle 2-3 mm T2 dark T1 hyperintense soft tissue nodule along the lower aspect of the pituitary infundibulum (series 10, image 18). 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 Vascular: Major intracranial vascular flow voids are preserved. Skull and upper cervical spine: Normal visible cervical spine, bone marrow signal. Sinuses/Orbits: Negative. Other findings: Mastoids are well pneumatized. Visible internal auditory structures appear normal. Scalp and face appear negative. IMPRESSION: 1. No acute intracranial abnormality and largely normal for age noncontrast MRI appearance of the brain. 2. Tiny benign appearing suprasellar nodule. Favor congenital cyst or similar etiology. Electronically Signed   By: Genevie Ann M.D.   On: 10/17/2019 18:10       Antonietta Breach, PA-C 37/34/28 [id number]    Delora Fuel, MD 15/72/62 [phone]

## 2019-10-18 NOTE — Telephone Encounter (Signed)
I saw the ER visit  I agree with the neuro consult Glad she is feeling better

## 2019-11-27 ENCOUNTER — Other Ambulatory Visit: Payer: Self-pay

## 2019-11-27 ENCOUNTER — Other Ambulatory Visit: Payer: Self-pay | Admitting: Family Medicine

## 2019-11-27 ENCOUNTER — Encounter: Payer: Self-pay | Admitting: Neurology

## 2019-11-27 ENCOUNTER — Ambulatory Visit: Payer: PPO | Admitting: Neurology

## 2019-11-27 VITALS — BP 124/80 | HR 76 | Ht 64.0 in | Wt 158.0 lb

## 2019-11-27 DIAGNOSIS — G454 Transient global amnesia: Secondary | ICD-10-CM | POA: Diagnosis not present

## 2019-11-27 DIAGNOSIS — E559 Vitamin D deficiency, unspecified: Secondary | ICD-10-CM

## 2019-11-27 NOTE — Telephone Encounter (Signed)
She can con't for 1 month then take 1000 u otc vita d3 qd Recheck vita d in 1 month

## 2019-11-27 NOTE — Progress Notes (Addendum)
NEUROLOGY CONSULTATION NOTE  Olivia [name] MRN: [id number] DOB: 1955-12-08  Referring provider: Deno Etienne, DO (ED referral) Primary care provider: Roma Schanz, DO  Reason for consult:  confusion  HISTORY OF PRESENT ILLNESS: Olivia [name] is a 64 year old  female who presents for confusion.  History supplemented by ED note.  CT and MRI of brain personally reviewed.  On 10/17/2019, she was vigorously cleaning the bathroom floor with Comet to get ready for guests coming to visit later that month.  The room was not ventilated.  Afterwards, she came down stairs and felt confused.  She kept repeating same question about when the guests will arrive to her husband 3 to 4 times in a row.  This lasted for 2 hours.  No facial droop, unilateral weakness, slurred speech, language/speech disturbance or headache.  She went to the ED where CT head showed no acute findings.  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 EKG, CBC, CMP, UA and UDS unremarkable.  Since then, she has felt fine.  No previous history of similar event.  No history of seizures.  She has occasional mild to moderate headache from time to time but nothing significant that keeps her in bed or that she would consider migraine.   PAST MEDICAL HISTORY: Past Medical History:  Diagnosis Date  . Pre-diabetes     PAST SURGICAL HISTORY: History reviewed. No pertinent surgical history.  MEDICATIONS: Current Outpatient Medications on File Prior to Visit  Medication Sig Dispense Refill  . aspirin-acetaminophen-caffeine (EXCEDRIN MIGRAINE) 250-250-65 MG per tablet Take 1 tablet by mouth every 6 (six) hours as needed.    . naproxen sodium (ALEVE) 220 MG tablet Take 220 mg by mouth.     No current facility-administered medications on file prior to visit.     ALLERGIES: No Known Allergies  FAMILY HISTORY: Family History  Problem Relation Age of Onset  . Arthritis Mother   . Dementia Mother   . Heart disease Father   . Stroke Father   . Hypertension Father   . Diabetes Father   . Cancer Father        liver    SOCIAL HISTORY: Social History   Socioeconomic History  . Marital status: Married    Spouse name: Not on file  . Number of children: 2  . Years of education: Not on file  . Highest education level: Not on file  Occupational History  . 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 Smoking status: Never Smoker  . Smokeless tobacco: Never Used  Vaping Use  . Vaping Use: Never used  Substance and Sexual Activity  . Alcohol use: Yes    Alcohol/week: 2.0 standard drinks    Types: 2 Standard drinks or equivalent per week    Comment: Once a week  . Drug use: No  . Sexual activity: Yes    Partners: Male  Other Topics Concern  . Not on file  Social History Narrative   Exercise---  3-4 x a week   Right Handed   Lives in a two story home   Drinks no caffeine    Social Determinants of Health   Financial Resource Strain:   . Difficulty of Paying Living Expenses: Not on file  Food Insecurity:   . Worried  About Running Out of Food in the Last Year: Not on file  . Ran Out of Food in the Last Year: Not on file  Transportation Needs:   . Lack of Transportation (Medical): Not on file  . Lack of Transportation (Non-Medical): Not on file  Physical Activity:   . Days of Exercise per Week: Not on file  . Minutes of Exercise per Session: Not on file  Stress:   . Feeling of Stress : Not on file  Social Connections:   . Frequency of Communication with Friends and Family: Not on file  . Frequency of Social Gatherings with Friends and Family: Not on file  . Attends Religious Services: Not on file  . Active Member of Clubs or Organizations: Not on file  . Attends Archivist  Meetings: Not on file  . Marital Status: Not on file  Intimate Partner Violence:   . Fear of Current or Ex-Partner: Not on file  . Emotionally Abused: Not on file  . Physically Abused: Not on file  . Sexually Abused: Not on file   PHYSICAL EXAM: Blood pressure 124/80, pulse 76, height 5\' 4"  (1.626 m), weight 158 lb (71.7 kg), SpO2 100 %. General: No acute distress.  Patient appears well-groomed.  Head:  Normocephalic/atraumatic Eyes:  fundi examined but not visualized Neck: supple, no paraspinal tenderness, full range of motion Back: No paraspinal tenderness Heart: regular rate and rhythm Lungs: Clear to auscultation bilaterally. Vascular: No carotid bruits. Neurological Exam: Mental status: alert and oriented to person, place, and time, recent and remote memory intact, fund of knowledge intact, attention and concentration intact, speech fluent and not dysarthric, language intact. Cranial nerves: CN I: not tested CN II: pupils equal, round and reactive to light, visual fields intact CN III, IV, VI:  full range of motion, no nystagmus, no ptosis CN V: facial sensation intact CN VII: upper and lower face symmetric CN VIII: hearing intact CN IX, X: gag intact, uvula midline CN XI: sternocleidomastoid and trapezius muscles intact CN XII: tongue midline Bulk & Tone: normal, no fasciculations. Motor:  5/5 throughout  Sensation:  Pinprick and vibration sensation intact. Deep Tendon Reflexes:  2+ throughout, toes downgoing.  Finger to nose testing:  Without dysmetria.  Heel to shin:  Without dysmetria.  Gait:  Normal station and stride.  Romberg negative.  IMPRESSION: Transient global amnesia  PLAN: 1.  To complete workup, will check routine EEG.  Further recommendations pending results. 2.  Otherwise, if EEG unremarkable then she will follow up as needed.  12/02/2019 ADDENDUM:  EEG on 12/02/2019 was normal.  Follow up as needed.  Thank you for allowing me to take part in the  care of this patient.  Metta Clines, DO  CC: Roma Schanz, DO

## 2019-11-27 NOTE — Telephone Encounter (Signed)
Do you want Pt to continue taking Vit D?

## 2019-11-27 NOTE — Patient Instructions (Signed)
As per protocol, will check an EEG.  Further recommendations pending results.   Transient Global Amnesia Transient global amnesia causes a sudden and temporary (transient) loss of memory (amnesia). You may recall memories from your distant past and people you know well. However, you may not recall things that happened more recently in the past days, months, or even year. A transient global amnesia episode does not last longer than 24 hours. Transient global amnesia does not affect your other brain functions. Your memory usually returns to normal after an episode is over. One episode of transient global amnesia does not make you more likely to have a stroke, a relapse, or other complications. What are the causes? The cause of this condition is not known. Certain activities have been reported to trigger transient global amnesia. These activities include:  Swimming in very cold or hot water.  Sexual intercourse.  Emotional distress, such as receiving bad news or having a lot of stress at once.  Strenuous exercise or activity. What increases the risk? You are more likely to develop this condition if:  You are 50-35 years old.  You have a history of migraine headaches. What are the signs or symptoms? The main symptoms of this condition include:  Being unable to remember recent events.  Asking repetitive questions about a situation and surroundings and not recalling the answers to these questions. Other symptoms include:  Restlessness and nervousness.  Confusion.  Headaches.  Dizziness.  Nausea. How is this diagnosed? This condition may be diagnosed based on:  Your symptoms.  A physical exam.  A test to check your mental abilities (cognitive evaluation).  Imaging studies to check brain function. These may include: ? Electroencephalogram (EEG). This test checks the brain's electrical activity. ? CT scan. ? MRI. How is this treated? There is no treatment for this  condition. An episode typically goes away on its own after a few hours. You may receive medicines to treat other conditions, such as a migraine. Follow these instructions at home:  Take over-the-counter and prescription medicines only as told by your health care provider.  Avoid taking medicines that can affect thinking, such as pain or sleeping medicines.  Learn what activities may trigger an episode. Avoid these activities as told by your health care provider.  Find ways to manage stress, such as meditation or yoga.  Keep all follow-up visits as told by your health care provider. This is important. Contact a health care provider if you:  Have a migraine that does not go away.  Experience transient global amnesia repeatedly. Get help right away if you:  Have a seizure. Summary  Transient global amnesia causes a sudden and temporary (transient) loss of memory (amnesia).  There is no treatment for this condition. An episode typically goes away on its own after a few hours.  You may receive medicines to treat other conditions, such as a migraine.  Transient global amnesia does not affect your other brain functions. Your memory usually returns to normal after an episode is over. This information is not intended to replace advice given to you by your health care provider. Make sure you discuss any questions you have with your health care provider. Document Revised: 02/08/2017 Document Reviewed: 02/08/2017 Elsevier Patient Education  2020 [employer].

## 2019-12-02 ENCOUNTER — Ambulatory Visit: Payer: PPO | Admitting: Neurology

## 2019-12-02 ENCOUNTER — Other Ambulatory Visit: Payer: Self-pay

## 2019-12-02 DIAGNOSIS — G454 Transient global amnesia: Secondary | ICD-10-CM

## 2019-12-02 NOTE — Procedures (Signed)
ELECTROENCEPHALOGRAM REPORT  Date of Study: 12/02/2019  Patient's Name: Olivia [name] MRN: [id number] Date of Birth: 28-Mar-1955  Clinical History: 64 year old female with suspected transient global amnesia  Medications: Excedrin Migraine Aleve  Technical Summary: A multichannel digital EEG recording measured by the international 10-20 system with electrodes applied with paste and impedances below 5000 ohms performed in our laboratory with EKG monitoring in an awake and drowsy patient.  Hyperventilation was not performed as patient wearing face mask due to the [place] pandemic.  Photic stimulation was performed.  The digital EEG was referentially recorded, reformatted, and digitally filtered in a variety of bipolar and referential montages for optimal display.    Description: The patient is awake and drowsy during the recording.  During maximal wakefulness, there is a symmetric, medium voltage 9-10 Hz posterior dominant rhythm that attenuates with eye opening.  The record is symmetric.  Stage 2 sleep was not seen. Photic stimulation did not elicit any abnormalities.  There were no epileptiform discharges or electrographic seizures seen.    EKG lead was unremarkable.  Impression: This awake and drowsy EEG is normal.    Clinical Correlation: A normal EEG does not exclude a clinical diagnosis of epilepsy.  If further clinical questions remain, prolonged EEG may be helpful.  Clinical correlation is advised.   Metta Clines, DO

## 2019-12-04 ENCOUNTER — Telehealth: Payer: Self-pay

## 2019-12-04 NOTE — Telephone Encounter (Signed)
Called patient and informed her of results. Patient verbalized understanding. 

## 2019-12-04 NOTE — Telephone Encounter (Signed)
-----   Message from Pieter Partridge, DO sent at 12/02/2019  4:15 PM EDT ----- EEG normal.  Follow up as needed

## 2019-12-10 DIAGNOSIS — L820 Inflamed seborrheic keratosis: Secondary | ICD-10-CM | POA: Diagnosis not present

## 2019-12-10 DIAGNOSIS — D485 Neoplasm of uncertain behavior of skin: Secondary | ICD-10-CM | POA: Diagnosis not present

## 2020-03-09 ENCOUNTER — Other Ambulatory Visit: Payer: Self-pay | Admitting: Family Medicine

## 2020-03-09 ENCOUNTER — Telehealth: Payer: Self-pay | Admitting: Family Medicine

## 2020-03-09 DIAGNOSIS — E559 Vitamin D deficiency, unspecified: Secondary | ICD-10-CM

## 2020-03-09 DIAGNOSIS — E785 Hyperlipidemia, unspecified: Secondary | ICD-10-CM

## 2020-03-09 NOTE — Telephone Encounter (Signed)
Pt had last CPE 3.22.21. patient was scheduled for next appt on 3.22.22  I called to reschedule appt after the 22nd.  Patient would like for labs to be put in before appt so she can fast.   Can we insert them for her & ill call back to make lab appt?

## 2020-03-09 NOTE — Telephone Encounter (Signed)
Order is in.

## 2020-03-09 NOTE — Telephone Encounter (Signed)
Please advise 

## 2020-03-13 ENCOUNTER — Other Ambulatory Visit: Payer: Self-pay

## 2020-03-13 ENCOUNTER — Ambulatory Visit: Payer: Self-pay

## 2020-03-13 ENCOUNTER — Ambulatory Visit (INDEPENDENT_AMBULATORY_CARE_PROVIDER_SITE_OTHER): Payer: PPO

## 2020-03-13 ENCOUNTER — Encounter: Payer: Self-pay | Admitting: Family Medicine

## 2020-03-13 ENCOUNTER — Ambulatory Visit: Payer: PPO | Admitting: Family Medicine

## 2020-03-13 VITALS — BP 124/82 | HR 79 | Ht 64.0 in | Wt 160.0 lb

## 2020-03-13 DIAGNOSIS — M79644 Pain in right finger(s): Secondary | ICD-10-CM

## 2020-03-13 DIAGNOSIS — M65312 Trigger thumb, left thumb: Secondary | ICD-10-CM | POA: Diagnosis not present

## 2020-03-13 DIAGNOSIS — M79645 Pain in left finger(s): Secondary | ICD-10-CM

## 2020-03-13 DIAGNOSIS — G8929 Other chronic pain: Secondary | ICD-10-CM | POA: Diagnosis not present

## 2020-03-13 DIAGNOSIS — M25512 Pain in left shoulder: Secondary | ICD-10-CM

## 2020-03-13 DIAGNOSIS — M79642 Pain in left hand: Secondary | ICD-10-CM | POA: Diagnosis not present

## 2020-03-13 NOTE — Assessment & Plan Note (Signed)
No true triggering but does have a trigger nodule.  Discussed bracing at night, warm massage.  Worsening pain consider injection.

## 2020-03-13 NOTE — Patient Instructions (Signed)
Xray today Exercises today Hands in peripheral vision Aleve 2x a day for 3 day bursts Ice 20 min 2x a day Ok to stay active Thumb use heat and massage Frog splint at night See me in 6-8 weeks

## 2020-03-13 NOTE — Progress Notes (Signed)
Olivia [name] Phone: [phone] Subjective:   Olivia [name], am serving as a scribe for Dr. Hulan Saas. This visit occurred during the SARS-CoV-2 public health emergency.  Safety protocols were in place, including screening questions prior to the visit, additional usage of staff PPE, and extensive cleaning of exam room while observing appropriate contact time as indicated for disinfecting solutions.   I'm seeing this patient by the request  of:  Olivia Held, DO  CC: Left shoulder, left hand pain  [id number]  Olivia [name] is a 65 y.o. female coming in with complaint of left thumb and left shoulder pain. Last seen in 2020 for right shoulder pain. [name] pain in right shoulder. Now having pain in left shoulder pain that has gradually increased over past month. Unable to reproduce pain in office and is unsure where pain is. Motions that bother her are flexion and IR. Pain improves with movement typically.   Pain in left thumb for one month. Pain over [place] joint. Grasping increases her pain. Denies any tingling. Is concerned with paddling season coming up.       Past Medical History:  Diagnosis Date  . Pre-diabetes    [name] past surgical history on file. Social History   Socioeconomic History  . Marital status: Married    Spouse name: Not on file  . Number of children: 2  . Years of education: Not on file  . Highest education level: Not on file  Occupational History  . 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 Smoking status: Never Smoker  . Smokeless tobacco: Never Used  Vaping Use  . Vaping Use: Never used  Substance and Sexual Activity  . Alcohol use: Yes    Alcohol/week: 2.0 standard drinks    Types: 2 Standard drinks or equivalent per week    Comment: Once a week  . Drug use: [name]  . Sexual activity: Yes    Partners: Male  Other Topics Concern   . Not on file  Social History Narrative   Exercise---  3-4 x a week   Right Handed   Lives in a two story home   Drinks [name] caffeine    Social Determinants of Health   Financial Resource Strain: Not on file  Food Insecurity: Not on file  Transportation Needs: Not on file  Physical Activity: Not on file  Stress: Not on file  Social Connections: Not on file   [name] Known Allergies Family History  Problem Relation Age of Onset  . Arthritis Mother   . Dementia Mother   . Heart disease Father   . Stroke Father   . Hypertension Father   . Diabetes Father   . Cancer Father        liver       Current Outpatient Medications (Analgesics):  .  aspirin-acetaminophen-caffeine (EXCEDRIN MIGRAINE) 220-254-27 MG per tablet, Take 1 tablet by mouth every 6 (six) hours as needed. .  naproxen sodium (ALEVE) 220 MG tablet, Take 220 mg by mouth.   Current Outpatient Medications (Other):  Marland Kitchen  Vitamin D, Ergocalciferol, (DRISDOL) 1.25 MG (50000 UNIT) CAPS capsule, TAKE 1 CAPSULE BY MOUTH EVERY 7 DAYS   Reviewed prior external information including notes and imaging from  primary care provider As well as notes that were available from care everywhere and other healthcare systems.  Past medical history, social,  surgical and family history all reviewed in electronic medical record.  [name] pertanent information unless stated regarding to the chief complaint.   Review of Systems:  [name] headache, visual changes, nausea, vomiting, diarrhea, constipation, dizziness, abdominal pain, skin rash, fevers, chills, night sweats, weight loss, swollen lymph nodes, body aches, joint swelling, chest pain, shortness of breath, mood changes. POSITIVE muscle aches  Objective  Blood pressure 124/82, pulse 79, height 5\' 4"  (1.626 m), weight 160 lb (72.6 kg), SpO2 98 %.   General: [name] apparent distress alert and oriented x3 mood and affect normal, dressed appropriately.  HEENT: Pupils equal, extraocular movements intact   Respiratory: Patient's speak in full sentences and does not appear short of breath  Cardiovascular: [name] lower extremity edema, non tender, [name] erythema  Gait normal with good balance and coordination.  MSK:  Left shoulder exam does have a positive impingement noted.  Patient does have some mild pain with empty can but [name] true weakness.  Negative crossover and negative O'Brien's.  Patient does have near full range of motion but does have pain at the terminal internal and external range of motion of the left shoulder.  Left thumb does have a trigger nodule noted at the A2 [name].  Tender to palpation.  [name] triggering of the thumb though noted today.  Minimal pain over the [facility] joint  Limited musculoskeletal ultrasound was performed and interpreted by Olivia [name]  Limited ultrasound of patient's left shoulder shows that patient may have a mild bone spur on the lateral aspect of the humerus.  Patient's rotator cuff appears to be intact.  This includes the subscapularis and the supraspinatus.  Unremarkable bicep tendon noted as well Impression: Questionable impingement of the shoulder secondary to bone spur  [id number]; 15 additional minutes spent for Therapeutic exercises as stated in above notes.  This included exercises focusing on stretching, strengthening, with significant focus on eccentric aspects.   Long term goals include an improvement in range of motion, strength, endurance as well as avoiding reinjury. Patient's frequency would include in 1-2 times a day, 3-5 times a week for a duration of 6-12 weeks. Shoulder Exercises that included:  Basic scapular stabilization to include adduction and depression of scapula Scaption, focusing on proper movement and good control Internal and External rotation utilizing a theraband, with elbow tucked at side entire time Rows with theraband    Proper technique shown and discussed handout in great detail with ATC.  All questions were discussed and answered.      Impression and Recommendations:     The above documentation has been reviewed and is accurate and complete Olivia Pulley, DO

## 2020-03-13 NOTE — Assessment & Plan Note (Signed)
Patient does have more of the left shoulder pain.  Feels somewhat similar to her right shoulder.  Patient did have a rotator cuff tear but did do very well with conservative therapy.  Patient is continuing to have discomfort but on ultrasound I do not see any true rotator cuff tear appreciated at this time.  We discussed with patient about icing regimen, increasing activity slowly, given home exercises.  X-rays are pending.  Follow-up again 6 weeks.  Worsening pain will consider the possibility of intra-articular injections, formal physical therapy or advanced imaging

## 2020-04-21 ENCOUNTER — Other Ambulatory Visit: Payer: Self-pay

## 2020-04-21 ENCOUNTER — Other Ambulatory Visit (INDEPENDENT_AMBULATORY_CARE_PROVIDER_SITE_OTHER): Payer: PPO

## 2020-04-21 DIAGNOSIS — E559 Vitamin D deficiency, unspecified: Secondary | ICD-10-CM | POA: Diagnosis not present

## 2020-04-21 DIAGNOSIS — E785 Hyperlipidemia, unspecified: Secondary | ICD-10-CM

## 2020-04-21 LAB — COMPREHENSIVE METABOLIC PANEL
ALT: 17 U/L (ref 0–35)
AST: 16 U/L (ref 0–37)
Albumin: 4.3 g/dL (ref 3.5–5.2)
Alkaline Phosphatase: 79 U/L (ref 39–117)
BUN: 12 mg/dL (ref 6–23)
CO2: 29 mEq/L (ref 19–32)
Calcium: 9.6 mg/dL (ref 8.4–10.5)
Chloride: 101 mEq/L (ref 96–112)
Creatinine, Ser: 0.78 mg/dL (ref 0.40–1.20)
GFR: 79.25 mL/min (ref >60.00)
Glucose, Bld: 96 mg/dL (ref 70–99)
Potassium: 4.1 mEq/L (ref 3.5–5.1)
Sodium: 139 mEq/L (ref 135–145)
Total Bilirubin: 0.3 mg/dL (ref 0.2–1.2)
Total Protein: 7.2 g/dL (ref 6.0–8.3)

## 2020-04-21 LAB — LIPID PANEL
Cholesterol: 227 mg/dL — ABNORMAL HIGH (ref 0–200)
HDL: 63 mg/dL (ref >39.00)
LDL Cholesterol: 128 mg/dL — ABNORMAL HIGH (ref 0–99)
NonHDL: 163.82
Total CHOL/HDL Ratio: 4
Triglycerides: 178 mg/dL — ABNORMAL HIGH (ref 0.0–149.0)
VLDL: 35.6 mg/dL (ref 0.0–40.0)

## 2020-04-21 LAB — CBC WITH DIFFERENTIAL/PLATELET
Basophils Absolute: 0.1 10*3/uL (ref 0.0–0.1)
Basophils Relative: 0.8 % (ref 0.0–3.0)
Eosinophils Absolute: 0.3 10*3/uL (ref 0.0–0.7)
Eosinophils Relative: 4.2 % (ref 0.0–5.0)
HCT: 39.4 % (ref 36.0–46.0)
Hemoglobin: 13.6 g/dL (ref 12.0–15.0)
Lymphocytes Relative: 36.9 % (ref 12.0–46.0)
Lymphs Abs: 2.6 10*3/uL (ref 0.7–4.0)
MCHC: 34.5 g/dL (ref 30.0–36.0)
MCV: 86.4 fl (ref 78.0–100.0)
Monocytes Absolute: 0.6 10*3/uL (ref 0.1–1.0)
Monocytes Relative: 8 % (ref 3.0–12.0)
Neutro Abs: 3.6 10*3/uL (ref 1.4–7.7)
Neutrophils Relative %: 50.1 % (ref 43.0–77.0)
Platelets: 251 10*3/uL (ref 150.0–400.0)
RBC: 4.56 Mil/uL (ref 3.87–5.11)
RDW: 13.2 % (ref 11.5–15.5)
WBC: 7.1 10*3/uL (ref 4.0–10.5)

## 2020-04-21 LAB — VITAMIN D 25 HYDROXY (VIT D DEFICIENCY, FRACTURES): VITD: 53.75 ng/mL (ref 30.00–100.00)

## 2020-04-24 NOTE — Progress Notes (Signed)
[place] Phone: [phone] Subjective:   [name], am serving as a scribe for Dr. Hulan Saas. This visit occurred during the SARS-CoV-2 public health emergency.  Safety protocols were in place, including screening questions prior to the visit, additional usage of staff PPE, and extensive cleaning of exam room while observing appropriate contact time as indicated for disinfecting solutions.   I'm seeing this patient by the request  of:  Ann Held, DO  CC: Thumb pain, shoulder pain follow-up  MPN:[id number]   03/13/2020 No true triggering but does have a trigger nodule.  Discussed bracing at night, warm massage.  Worsening pain consider injection.        Patient does have more of the left shoulder pain.  Feels somewhat similar to her right shoulder.  Patient did have a rotator cuff tear but did do very well with conservative therapy.  Patient is continuing to have discomfort but on ultrasound I do not see any true rotator cuff tear appreciated at this time.  We discussed with patient about icing regimen, increasing activity slowly, given home exercises.  X-rays are pending.  Follow-up again 6 weeks.  Worsening pain will consider the possibility of intra-articular injections, formal physical therapy or advanced imaging       Update 04/27/2020 Olivia [name] is a 65 y.o. female coming in with complaint of L thumb and L shoulder. Thumb pain has subsided. Pain in shoulder is improving. Patient has pain with IR and flexion but that is improving. Less sharp, catching pain in general.  Patient has not been doing the exercises as regularly because she has been traveling now.  Using 10,000 IU of Vit D daily.      Past Medical History:  Diagnosis Date  . Pre-diabetes    No past surgical history on file. Social History   Socioeconomic History  . Marital status: Married    Spouse name: Not on file   . Number of children: 2  . Years of education: Not on file  . Highest education level: Not on file  Occupational History  . 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 Smoking status: Never Smoker  . Smokeless tobacco: Never Used  Vaping Use  . Vaping Use: Never used  Substance and Sexual Activity  . Alcohol use: Yes    Alcohol/week: 2.0 standard drinks    Types: 2 Standard drinks or equivalent per week    Comment: Once a week  . Drug use: No  . Sexual activity: Yes    Partners: Male  Other Topics Concern  . Not on file  Social History Narrative   Exercise---  3-4 x a week   Right Handed   Lives in a two story home   Drinks no caffeine    Social Determinants of Health   Financial Resource Strain: Not on file  Food Insecurity: Not on file  Transportation Needs: Not on file  Physical Activity: Not on file  Stress: Not on file  Social Connections: Not on file   No Known Allergies Family History  Problem Relation Age of Onset  . Arthritis Mother   . Dementia Mother   . Heart disease Father   . Stroke Father   . Hypertension Father   . Diabetes Father   . Cancer Father        liver  Current Outpatient Medications (Analgesics):  .  aspirin-acetaminophen-caffeine (EXCEDRIN MIGRAINE) 188-416-60 MG per tablet, Take 1 tablet by mouth every 6 (six) hours as needed. .  naproxen sodium (ALEVE) 220 MG tablet, Take 220 mg by mouth.   Current Outpatient Medications (Other):  Marland Kitchen  Vitamin D, Ergocalciferol, (DRISDOL) 1.25 MG (50000 UNIT) CAPS capsule, TAKE 1 CAPSULE BY MOUTH EVERY 7 DAYS   Reviewed prior external information including notes and imaging from  primary care provider As well as notes that were available from care everywhere and other healthcare systems.  Past medical history, social, surgical and family history all reviewed in electronic medical record.  No pertanent information unless stated regarding to the  chief complaint.   Review of Systems:  No headache, visual changes, nausea, vomiting, diarrhea, constipation, dizziness, abdominal pain, skin rash, fevers, chills, night sweats, weight loss, swollen lymph nodes, body aches, joint swelling, chest pain, shortness of breath, mood changes. POSITIVE muscle aches  Objective  Blood pressure 108/74, pulse 85, height 5\' 4"  (1.626 m), weight 162 lb (73.5 kg), SpO2 98 %.   General: No apparent distress alert and oriented x3 mood and affect normal, dressed appropriately.  HEENT: Pupils equal, extraocular movements intact  Respiratory: Patient's speak in full sentences and does not appear short of breath  Cardiovascular: No lower extremity edema, non tender, no erythema  Gait normal with good balance and coordination.  MSK: Left shoulder exam does have good range of motion.  No significant weakness of the rotator cuff noted.  Very mild pain with external rotation but otherwise.  Unremarkable.  On exam shows the patient still has some mild tenderness over the PIP.  There is a trigger nodule noted.  Patient will has a negative [facility] grind test.     Impression and Recommendations:     The above documentation has been reviewed and is accurate and complete Olivia Pulley, DO

## 2020-04-27 ENCOUNTER — Other Ambulatory Visit: Payer: Self-pay

## 2020-04-27 ENCOUNTER — Encounter: Payer: Self-pay | Admitting: Family Medicine

## 2020-04-27 ENCOUNTER — Ambulatory Visit: Payer: PPO | Admitting: Family Medicine

## 2020-04-27 ENCOUNTER — Ambulatory Visit: Payer: Self-pay

## 2020-04-27 VITALS — BP 108/74 | HR 85 | Ht 64.0 in | Wt 162.0 lb

## 2020-04-27 DIAGNOSIS — G8929 Other chronic pain: Secondary | ICD-10-CM | POA: Diagnosis not present

## 2020-04-27 DIAGNOSIS — M65312 Trigger thumb, left thumb: Secondary | ICD-10-CM | POA: Diagnosis not present

## 2020-04-27 DIAGNOSIS — M25512 Pain in left shoulder: Secondary | ICD-10-CM

## 2020-04-27 NOTE — Assessment & Plan Note (Signed)
Patient is doing much better at this time.  No need to make any significant changes.  Patient is to continue the home exercises.  Worsening pain can consider injection but do not think that advanced imaging would be warranted at this time

## 2020-04-27 NOTE — Patient Instructions (Signed)
Thanks for telling me about [place] Exercises more now that you are home See me in 6-8 weeks

## 2020-04-27 NOTE — Assessment & Plan Note (Signed)
Patient seems to be doing better.  Trigger nodule is still noted on exam but not giving her true triggering at the moment.  We discussed we could potentially consider the possibility of injections if necessary otherwise patient should do relatively well.

## 2020-04-28 ENCOUNTER — Encounter: Payer: PPO | Admitting: Family Medicine

## 2020-04-28 DIAGNOSIS — R7303 Prediabetes: Secondary | ICD-10-CM | POA: Insufficient documentation

## 2020-04-29 ENCOUNTER — Ambulatory Visit: Payer: PPO | Admitting: Cardiology

## 2020-04-29 ENCOUNTER — Other Ambulatory Visit: Payer: Self-pay

## 2020-04-29 ENCOUNTER — Encounter: Payer: Self-pay | Admitting: Cardiology

## 2020-04-29 VITALS — BP 132/60 | HR 90 | Ht 64.0 in | Wt 161.0 lb

## 2020-04-29 DIAGNOSIS — R7303 Prediabetes: Secondary | ICD-10-CM | POA: Diagnosis not present

## 2020-04-29 DIAGNOSIS — E782 Mixed hyperlipidemia: Secondary | ICD-10-CM

## 2020-04-29 NOTE — Patient Instructions (Signed)
Medication Instructions:  Your physician recommends that you continue on your current medications as directed. Please refer to the Current Medication list given to you today.  *If you need a refill on your cardiac medications before your next appointment, please call your pharmacy*   Lab Work: Your physician recommends that you return for lab work: TODAY:  Lpa If you have labs (blood work) drawn today and your tests are completely normal, you will receive your results only by: Marland Kitchen MyChart Message (if you have MyChart) OR . A paper copy in the mail If you have any lab test that is abnormal or we need to change your treatment, we will call you to review the results.   Testing/Procedures: None   Follow-Up: At [facility], you and your health needs are our priority.  As part of our continuing mission to provide you with exceptional heart care, we have created designated Provider Care Teams.  These Care Teams include your primary Cardiologist (physician) and Advanced Practice Providers (APPs -  Physician Assistants and Nurse Practitioners) who all work together to provide you with the care you need, when you need it.  We recommend signing up for the patient portal called "MyChart".  Sign up information is provided on this After Visit Summary.  MyChart is used to connect with patients for Virtual Visits (Telemedicine).  Patients are able to view lab/test results, encounter notes, upcoming appointments, etc.  Non-urgent messages can be sent to your provider as well.   To learn more about what you can do with MyChart, go to [email or web address].    Your next appointment:   1 year(s)  The format for your next appointment:   In Person  Provider:   Berniece Salines, DO   Other Instructions

## 2020-04-29 NOTE — Progress Notes (Signed)
Cardiology Office Note:    Date:  04/29/2020   ID:  Signe, Tackitt [birth year], MRN [id number]  PCP:  Carollee Herter, Alferd Apa, DO  Cardiologist:  Berniece Salines, DO  Electrophysiologist:  None   Referring MD: Carollee Herter, Alferd Apa, *     History of Present Illness:    Olivia [name] is a 65 y.o. female with a hx of prediabetes, vitamin D deficiency, hyperlipidemia not on statins here today for follow-up visit.  Last saw the patient in July 2021 at that time she was doing well from a cardiovascular standpoint.  The patient had requested a calcium scoring which was completed showed 0 calcium.  She is here today for follow-up visit.  She offers no complaints today.  She recently came back from [place] and enjoyed her trip.  I am happy for the patient.  Past Medical History:  Diagnosis Date  . Pre-diabetes     History reviewed. No pertinent surgical history.  Current Medications: Current Meds  Medication Sig  . aspirin-acetaminophen-caffeine (EXCEDRIN MIGRAINE) 250-250-65 MG per tablet Take 1 tablet by mouth every 6 (six) hours as needed.  . Multiple Vitamins-Minerals (IMMUNE SUPPORT PO) Take 2 capsules by mouth daily.  . naproxen sodium (ALEVE) 220 MG tablet Take 220 mg by mouth.  . Vitamin D, Ergocalciferol, (DRISDOL) 1.25 MG (50000 UNIT) CAPS capsule TAKE 1 CAPSULE BY MOUTH EVERY 7 DAYS     Allergies:   Patient has no known allergies.   Social History   Socioeconomic History  . Marital status: Married    Spouse name: Not on file  . Number of children: 2  . Years of education: Not on file  . Highest education level: Not on file  Occupational History  . 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 Smoking status: Never Smoker  . Smokeless tobacco: Never Used  Vaping Use  . Vaping Use: Never used  Substance and Sexual Activity  . Alcohol use: Yes    Alcohol/week: 2.0 standard drinks    Types: 2 Standard drinks or  equivalent per week    Comment: Once a week  . Drug use: No  . Sexual activity: Yes    Partners: Male  Other Topics Concern  . Not on file  Social History Narrative   Exercise---  3-4 x a week   Right Handed   Lives in a two story home   Drinks no caffeine    Social Determinants of Health   Financial Resource Strain: Not on file  Food Insecurity: Not on file  Transportation Needs: Not on file  Physical Activity: Not on file  Stress: Not on file  Social Connections: Not on file     Family History: The patient's family history includes Arthritis in her mother; Cancer in her father; Dementia in her mother; Diabetes in her father; Heart disease in her father; Hypertension in her father; Stroke in her father.  ROS:   Review of Systems  Constitution: Negative for decreased appetite, fever and weight gain.  HENT: Negative for congestion, ear discharge, hoarse voice and sore throat.   Eyes: Negative for discharge, redness, vision loss in right eye and visual halos.  Cardiovascular: Negative for chest pain, dyspnea on exertion, leg swelling, orthopnea and palpitations.  Respiratory: Negative for cough, hemoptysis, shortness of breath and snoring.   Endocrine: Negative for heat intolerance and polyphagia.  Hematologic/Lymphatic: Negative for bleeding problem. Does not bruise/bleed  easily.  Skin: Negative for flushing, nail changes, rash and suspicious lesions.  Musculoskeletal: Negative for arthritis, joint pain, muscle cramps, myalgias, neck pain and stiffness.  Gastrointestinal: Negative for abdominal pain, bowel incontinence, diarrhea and excessive appetite.  Genitourinary: Negative for decreased libido, genital sores and incomplete emptying.  Neurological: Negative for brief paralysis, focal weakness, headaches and loss of balance.  Psychiatric/Behavioral: Negative for altered mental status, depression and suicidal ideas.  Allergic/Immunologic: Negative for HIV exposure and  persistent infections.    EKGs/Labs/Other Studies Reviewed:    The following studies were reviewed today:   EKG: None today  Transthoracic echocardiogram IMPRESSIONS May 08, 2019 1. Left ventricular ejection fraction, by estimation, is 60 to 65%. The left ventricle has normal function. The left ventricle has no regional wall motion abnormalities. Left ventricular diastolic parameters were normal.  2. Right ventricular systolic function is normal. The right ventricular size is normal.  3. The mitral valve is normal in structure. No evidence of mitral valve regurgitation. No evidence of mitral stenosis.  4. The aortic valve is normal in structure. Aortic valve regurgitation is not visualized. No aortic stenosis is present.  5. The inferior vena cava is normal in size with greater than 50% respiratory variability, suggesting right atrial pressure of 3 mmHg.   FINDINGS  Left Ventricle: Left ventricular ejection fraction, by estimation, is 60 to 65%. The left ventricle has normal function. The left ventricle has no regional wall motion abnormalities. The left ventricular internal cavity size was normal in size. There is no left ventricular hypertrophy. Left ventricular diastolic parameters were normal.  Right Ventricle: The right ventricular size is normal. No increase in right ventricular wall thickness. Right ventricular systolic function is  normal.   Left Atrium: Left atrial size was normal in size.   Right Atrium: Right atrial size was normal in size.   Pericardium: There is no evidence of pericardial effusion. Presence of  pericardial fat pad.   Mitral Valve: The mitral valve is normal in structure. Normal mobility of  the mitral valve leaflets. No evidence of mitral valve regurgitation. No  evidence of mitral valve stenosis.   Tricuspid Valve: The tricuspid valve is normal in structure. Tricuspid  valve regurgitation is not demonstrated. No evidence of tricuspid   stenosis.   Aortic Valve: The aortic valve is normal in structure. Aortic valve  regurgitation is not visualized. No aortic stenosis is present.   Pulmonic Valve: The pulmonic valve was not well visualized. Pulmonic valve  regurgitation is not visualized. No evidence of pulmonic stenosis.   Aorta: The aortic root is normal in size and structure.   Venous: The inferior vena cava is normal in size with greater than 50% respiratory variability, suggesting right atrial pressure of 3 mmHg.   IAS/Shunts: No atrial level shunt detected by color flow Doppler.   Coronary CT calcium score August 2021 FINDINGS: Non-cardiac: See separate report from [facility].  Ascending Aorta: Normal Caliber.  No calcifications.  Pericardium: Normal  Coronary arteries: Normal coronary origins.  IMPRESSION: Coronary calcium score of 0. This was 0 percentile for age and sex matched control.   Recent Labs: 04/21/2020: ALT 17; BUN 12; Creatinine, Ser 0.78; Hemoglobin 13.6; Platelets 251.0; Potassium 4.1; Sodium 139  Recent Lipid Panel    Component Value Date/Time   CHOL 227 (H) 04/21/2020 0808   CHOL 253 (H) 08/21/2019 0903   TRIG 178.0 (H) 04/21/2020 0808   HDL 63.00 04/21/2020 0808   HDL 57 08/21/2019 0903   CHOLHDL  4 04/21/2020 0808   VLDL 35.6 04/21/2020 0808   LDLCALC 128 (H) 04/21/2020 0808   LDLCALC 165 (H) 08/21/2019 0903   LDLCALC 128 (H) 04/21/2017 1514   LDLDIRECT 164.5 11/21/2012 1157    Physical Exam:    VS:  BP 132/60   Pulse 90   Ht 5\' 4"  (1.626 m)   Wt 161 lb (73 kg)   SpO2 94%   BMI 27.64 kg/m     Wt Readings from Last 3 Encounters:  04/29/20 161 lb (73 kg)  04/27/20 162 lb (73.5 kg)  03/13/20 160 lb (72.6 kg)     GEN: Well nourished, well developed in no acute distress HEENT: Normal NECK: No JVD; No carotid bruits LYMPHATICS: No lymphadenopathy CARDIAC: S1S2 noted,RRR, no murmurs, rubs, gallops RESPIRATORY:  Clear to auscultation without rales,  wheezing or rhonchi  ABDOMEN: Soft, non-tender, non-distended, +bowel sounds, no guarding. EXTREMITIES: No edema, No cyanosis, no clubbing MUSCULOSKELETAL:  No deformity  SKIN: Warm and dry NEUROLOGIC:  Alert and oriented x 3, non-focal PSYCHIATRIC:  Normal affect, good insight  ASSESSMENT:    1. Mixed hyperlipidemia   2. Pre-diabetes    PLAN:      She had a recent lipid profile done on April 21, 2020 which showed HDL 63, LDL 123, total cholesterol 227 and triglyceride 178 there has been some improvement with her total cholesterol and her LDL.  She is really watching her diet.  I suspect that this may be familial hyperlipidemia as well.  We will do a LP(a) today.  If this is significantly elevated we might consider discussing possibility of using statins versus PCSK9 inhibitors.  But for right now she will continue with diet modification.  We also discussed her coronary calcium scoring which showed 0 calcium.  With a calcium score of 0 suggest low risk however does not rule out soft plaque.  Prediabetes has been managed with diet modification.  The patient is in agreement with the above plan. The patient left the office in stable condition.  The patient will follow up in 1 year or sooner if needed.   Medication Adjustments/Labs and Tests Ordered: Current medicines are reviewed at length with the patient today.  Concerns regarding medicines are outlined above.  No orders of the defined types were placed in this encounter.  No orders of the defined types were placed in this encounter.   There are no Patient Instructions on file for this visit.   Adopting a Healthy Lifestyle.  Know what a healthy weight is for you (roughly BMI <25) and aim to maintain this   Aim for 7+ servings of fruits and vegetables daily   65-80+ fluid ounces of water or unsweet tea for healthy kidneys   Limit to max 1 drink of alcohol per day; avoid smoking/tobacco   Limit animal fats in diet for  cholesterol and heart health - choose grass fed whenever available   Avoid highly processed foods, and foods high in saturated/trans fats   Aim for low stress - take time to unwind and care for your mental health   Aim for 150 min of moderate intensity exercise weekly for heart health, and weights twice weekly for bone health   Aim for 7-9 hours of sleep daily   When it comes to diets, agreement about the perfect plan isnt easy to find, even among the experts. Experts at the [employer] developed an idea known as the Healthy Eating Plate. Just imagine a plate  divided into logical, healthy portions.   The emphasis is on diet quality:   Load up on vegetables and fruits - one-half of your plate: Aim for color and variety, and remember that potatoes dont count.   Go for whole grains - one-quarter of your plate: Whole wheat, barley, wheat berries, quinoa, oats, brown rice, and foods made with them. If you want pasta, go with whole wheat pasta.   Protein power - one-quarter of your plate: Fish, chicken, beans, and nuts are all healthy, versatile protein sources. Limit red meat.   The diet, however, does go beyond the plate, offering a few other suggestions.   Use healthy plant oils, such as olive, canola, soy, corn, sunflower and peanut. Check the labels, and avoid partially hydrogenated oil, which have unhealthy trans fats.   If youre thirsty, drink water. Coffee and tea are good in moderation, but skip sugary drinks and limit milk and dairy products to one or two daily servings.   The type of carbohydrate in the diet is more important than the amount. Some sources of carbohydrates, such as vegetables, fruits, whole grains, and beans-are healthier than others.   Finally, stay active  Signed, Berniece Salines, DO  04/29/2020 8:36 AM    [place]

## 2020-04-30 ENCOUNTER — Other Ambulatory Visit: Payer: Self-pay

## 2020-04-30 LAB — LIPOPROTEIN A (LPA): Lipoprotein (a): 16.8 nmol/L (ref <75.0)

## 2020-05-01 ENCOUNTER — Encounter: Payer: Self-pay | Admitting: Family Medicine

## 2020-05-01 ENCOUNTER — Ambulatory Visit (INDEPENDENT_AMBULATORY_CARE_PROVIDER_SITE_OTHER): Payer: PPO | Admitting: Family Medicine

## 2020-05-01 ENCOUNTER — Other Ambulatory Visit (HOSPITAL_BASED_OUTPATIENT_CLINIC_OR_DEPARTMENT_OTHER): Payer: Self-pay | Admitting: Family Medicine

## 2020-05-01 VITALS — BP 116/66 | HR 87 | Temp 98.1°F | Resp 18 | Ht 64.0 in | Wt 161.2 lb

## 2020-05-01 DIAGNOSIS — R7303 Prediabetes: Secondary | ICD-10-CM

## 2020-05-01 DIAGNOSIS — Z0000 Encounter for general adult medical examination without abnormal findings: Secondary | ICD-10-CM

## 2020-05-01 DIAGNOSIS — E782 Mixed hyperlipidemia: Secondary | ICD-10-CM

## 2020-05-01 DIAGNOSIS — Z1231 Encounter for screening mammogram for malignant neoplasm of breast: Secondary | ICD-10-CM

## 2020-05-01 DIAGNOSIS — Z23 Encounter for immunization: Secondary | ICD-10-CM | POA: Diagnosis not present

## 2020-05-01 LAB — HEMOGLOBIN A1C: Hgb A1c MFr Bld: 6 % (ref 4.6–6.5)

## 2020-05-01 NOTE — Assessment & Plan Note (Signed)
Check a1c and insulin today

## 2020-05-01 NOTE — Progress Notes (Signed)
Subjective:     Olivia [name] is a 65 y.o. female and is here for a comprehensive physical exam. The patient reports no problems.  Social History   Socioeconomic History  . Marital status: Married    Spouse name: Not on file  . Number of children: 2  . Years of education: Not on file  . Highest education level: Not on file  Occupational History  . 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 Smoking status: Never Smoker  . Smokeless tobacco: Never Used  Vaping Use  . Vaping Use: Never used  Substance and Sexual Activity  . Alcohol use: Yes    Alcohol/week: 2.0 standard drinks    Types: 2 Standard drinks or equivalent per week    Comment: Once a week  . Drug use: No  . Sexual activity: Yes    Partners: Male  Other Topics Concern  . Not on file  Social History Narrative   Exercise---  3-4 x a week   Right Handed   Lives in a two story home   Drinks no caffeine    Social Determinants of Health   Financial Resource Strain: Not on file  Food Insecurity: Not on file  Transportation Needs: Not on file  Physical Activity: Not on file  Stress: Not on file  Social Connections: Not on file  Intimate Partner Violence: Not on file   Health Maintenance  Topic Date Due  . PNA vac Low Risk Adult (1 of 2 - PCV13) Never done  . MAMMOGRAM  04/30/2020  . INFLUENZA VACCINE  06/17/2020 (Originally 09/08/2019)  . TETANUS/TDAP  09/07/2020  . COLONOSCOPY (Pts 45-46yrs Insurance coverage will need to be confirmed)  04/04/2026  . DEXA SCAN  Completed  . COVID-19 Vaccine  Completed  . Hepatitis C Screening  Completed  . HPV VACCINES  Aged Out    The following portions of the patient's history were reviewed and updated as appropriate:  She  has a past medical history of Pre-diabetes. She does not have any pertinent problems on file. She  has no past surgical history on file. Her family history includes Arthritis in her mother; Cancer in her  father; Dementia in her mother; Diabetes in her father; Heart disease in her father; Hypertension in her father; Stroke in her father. She  reports that she has never smoked. She has never used smokeless tobacco. She reports current alcohol use of about 2.0 standard drinks of alcohol per week. She reports that she does not use drugs. She has a current medication list which includes the following prescription(s): aspirin-acetaminophen-caffeine, cholecalciferol, multiple vitamins-minerals, and naproxen sodium. Current Outpatient Medications on File Prior to Visit  Medication Sig Dispense Refill  . aspirin-acetaminophen-caffeine (EXCEDRIN MIGRAINE) 250-250-65 MG per tablet Take 1 tablet by mouth every 6 (six) hours as needed.    . Cholecalciferol (VITAMIN D HIGH POTENCY PO) Take by mouth. Per pt taking 10,000 units daily    . Multiple Vitamins-Minerals (IMMUNE SUPPORT PO) Take 2 capsules by mouth daily.    . naproxen sodium (ALEVE) 220 MG tablet Take 220 mg by mouth.     No current facility-administered medications on file prior to visit.   She has No Known Allergies..  Review of Systems Review of Systems  Constitutional: Negative for activity change, appetite change and fatigue.  HENT: Negative for hearing loss, congestion, tinnitus and ear discharge.  dentist q21m Eyes: Negative for visual disturbance (  see optho q1y -- vision corrected to 20/20 with glasses).  Respiratory: Negative for cough, chest tightness and shortness of breath.   Cardiovascular: Negative for chest pain, palpitations and leg swelling.  Gastrointestinal: Negative for abdominal pain, diarrhea, constipation and abdominal distention.  Genitourinary: Negative for urgency, frequency, decreased urine volume and difficulty urinating.  Musculoskeletal: Negative for back pain, arthralgias and gait problem.  Skin: Negative for color change, pallor and rash.  Neurological: Negative for dizziness, light-headedness, numbness and  headaches.  Hematological: Negative for adenopathy. Does not bruise/bleed easily.  Psychiatric/Behavioral: Negative for suicidal ideas, confusion, sleep disturbance, self-injury, dysphoric mood, decreased concentration and agitation.       Objective:    BP 116/66 (BP Location: Right Arm, Patient Position: Sitting, Cuff Size: Normal)   Pulse 87   Temp 98.1 F (36.7 C) (Oral)   Resp 18   Ht 5\' 4"  (1.626 m)   Wt 161 lb 3.2 oz (73.1 kg)   SpO2 97%   BMI 27.67 kg/m  General appearance: alert, cooperative, appears stated age and no distress Head: Normocephalic, without obvious abnormality, atraumatic Eyes: negative findings: lids and lashes normal, conjunctivae and sclerae normal and pupils equal, round, reactive to light and accomodation Ears: normal TM's and external ear canals both ears Neck: no adenopathy, no carotid bruit, no JVD, supple, symmetrical, trachea midline and thyroid not enlarged, symmetric, no tenderness/mass/nodules Back: symmetric, no curvature. ROM normal. No CVA tenderness. Lungs: clear to auscultation bilaterally Heart: regular rate and rhythm, S1, S2 normal, no murmur, click, rub or gallop Abdomen: soft, non-tender; bowel sounds normal; no masses,  no organomegaly Pelvic: not indicated; post-menopausal, no abnormal Pap smears in past Extremities: extremities normal, atraumatic, no cyanosis or edema Pulses: 2+ and symmetric Skin: Skin color, texture, turgor normal. No rashes or lesions Lymph nodes: Cervical, supraclavicular, and axillary nodes normal. Neurologic: Alert and oriented X 3, normal strength and tone. Normal symmetric reflexes. Normal coordination and gait    Assessment:    Healthy female exam.      Plan:    ghm utd Check labs  See After Visit Summary for Counseling Recommendations    1. Need for pneumococcal vaccination   - Pneumococcal polysaccharide vaccine 23-valent greater than or equal to 2yo subcutaneous/IM  2. Prediabetes con't  diet and exercise  - Insulin, random - Hemoglobin A1c  3. Mixed hyperlipidemia Per cardiology  4. Pre-diabetes Check labs today

## 2020-05-01 NOTE — Patient Instructions (Signed)
Preventive Care 61 Years and Older, Female Preventive care refers to lifestyle choices and visits with your health care provider that can promote health and wellness. This includes:  A yearly physical exam. This is also called an annual wellness visit.  Regular dental and eye exams.  Immunizations.  Screening for certain conditions.  Healthy lifestyle choices, such as: ? Eating a healthy diet. ? Getting regular exercise. ? Not using drugs or products that contain nicotine and tobacco. ? Limiting alcohol use. What can I expect for my preventive care visit? Physical exam Your health care provider will check your:  Height and weight. These may be used to calculate your BMI (body mass index). BMI is a measurement that tells if you are at a healthy weight.  Heart rate and blood pressure.  Body temperature.  Skin for abnormal spots. Counseling Your health care provider may ask you questions about your:  Past medical problems.  Family's medical history.  Alcohol, tobacco, and drug use.  Emotional well-being.  Home life and relationship well-being.  Sexual activity.  Diet, exercise, and sleep habits.  History of falls.  Memory and ability to understand (cognition).  Work and work Statistician.  Pregnancy and menstrual history.  Access to firearms. What immunizations do I need? Vaccines are usually given at various ages, according to a schedule. Your health care provider will recommend vaccines for you based on your age, medical history, and lifestyle or other factors, such as travel or where you work.   What tests do I need? Blood tests  Lipid and cholesterol levels. These may be checked every 5 years, or more often depending on your overall health.  Hepatitis C test.  Hepatitis B test. Screening  Lung cancer screening. You may have this screening every year starting at age 74 if you have a 30-pack-year history of smoking and currently smoke or have quit within  the past 15 years.  Colorectal cancer screening. ? All adults should have this screening starting at age 44 and continuing until age 58. ? Your health care provider may recommend screening at age 2 if you are at increased risk. ? You will have tests every 1-10 years, depending on your results and the type of screening test.  Diabetes screening. ? This is done by checking your blood sugar (glucose) after you have not eaten for a while (fasting). ? You may have this done every 1-3 years.  Mammogram. ? This may be done every 1-2 years. ? Talk with your health care provider about how often you should have regular mammograms.  Abdominal aortic aneurysm (AAA) screening. You may need this if you are a current or former smoker.  BRCA-related cancer screening. This may be done if you have a family history of breast, ovarian, tubal, or peritoneal cancers. Other tests  STD (sexually transmitted disease) testing, if you are at risk.  Bone density scan. This is done to screen for osteoporosis. You may have this done starting at age 104. Talk with your health care provider about your test results, treatment options, and if necessary, the need for more tests. Follow these instructions at home: Eating and drinking  Eat a diet that includes fresh fruits and vegetables, whole grains, lean protein, and low-fat dairy products. Limit your intake of foods with high amounts of sugar, saturated fats, and salt.  Take vitamin and mineral supplements as recommended by your health care provider.  Do not drink alcohol if your health care provider tells you not to drink.  If you drink alcohol: ? Limit how much you have to 0-1 drink a day. ? Be aware of how much alcohol is in your drink. In the [place] one drink equals one 12 oz bottle of beer (355 mL), one 5 oz glass of wine (148 mL), or one 1 oz glass of hard liquor (44 mL).   Lifestyle  Take daily care of your teeth and gums. Brush your teeth every morning  and night with fluoride toothpaste. Floss one time each day.  Stay active. Exercise for at least 30 minutes 5 or more days each week.  Do not use any products that contain nicotine or tobacco, such as cigarettes, e-cigarettes, and chewing tobacco. If you need help quitting, ask your health care provider.  Do not use drugs.  If you are sexually active, practice safe sex. Use a condom or other form of protection in order to prevent STIs (sexually transmitted infections).  Talk with your health care provider about taking a low-dose aspirin or statin.  Find healthy ways to cope with stress, such as: ? Meditation, yoga, or listening to music. ? Journaling. ? Talking to a trusted person. ? Spending time with friends and family. Safety  Always wear your seat belt while driving or riding in a vehicle.  Do not drive: ? If you have been drinking alcohol. Do not ride with someone who has been drinking. ? When you are tired or distracted. ? While texting.  Wear a helmet and other protective equipment during sports activities.  If you have firearms in your house, make sure you follow all gun safety procedures. What's next?  Visit your health care provider once a year for an annual wellness visit.  Ask your health care provider how often you should have your eyes and teeth checked.  Stay up to date on all vaccines. This information is not intended to replace advice given to you by your health care provider. Make sure you discuss any questions you have with your health care provider. Document Revised: 01/15/2020 Document Reviewed: 01/18/2018 Elsevier Patient Education  2021 Elsevier Inc.  

## 2020-05-01 NOTE — Assessment & Plan Note (Signed)
Calcium score 0 and Lpa normal F/u cardiology 1 year

## 2020-05-04 ENCOUNTER — Encounter (HOSPITAL_BASED_OUTPATIENT_CLINIC_OR_DEPARTMENT_OTHER): Payer: Self-pay

## 2020-05-04 ENCOUNTER — Ambulatory Visit (HOSPITAL_BASED_OUTPATIENT_CLINIC_OR_DEPARTMENT_OTHER)
Admission: RE | Admit: 2020-05-04 | Discharge: 2020-05-04 | Disposition: A | Discharge status: Home / Self Care | Payer: PPO | Source: Ambulatory Visit | Attending: Family Medicine | Admitting: Family Medicine

## 2020-05-04 ENCOUNTER — Other Ambulatory Visit: Payer: Self-pay

## 2020-05-04 DIAGNOSIS — Z1231 Encounter for screening mammogram for malignant neoplasm of breast: Secondary | ICD-10-CM | POA: Insufficient documentation

## 2020-05-04 LAB — INSULIN, RANDOM: Insulin: 41 u[IU]/mL — ABNORMAL HIGH

## 2020-05-06 ENCOUNTER — Encounter: Payer: Self-pay | Admitting: Family Medicine

## 2020-05-06 ENCOUNTER — Other Ambulatory Visit: Payer: Self-pay

## 2020-05-06 DIAGNOSIS — E119 Type 2 diabetes mellitus without complications: Secondary | ICD-10-CM

## 2020-05-06 MED ORDER — BLOOD GLUCOSE METER KIT: PACK | 0 refills | Status: DC

## 2020-05-07 NOTE — Telephone Encounter (Signed)
And diabetes was not added to her chart 0--- she is prediabetic

## 2020-05-07 NOTE — Telephone Encounter (Signed)
That iscorrect and her a1c has been creeping up--- I really just wanted her to concentrate on diet --- and sending her the ed material to work on it so it would not go up further ---no medication was started

## 2020-05-27 ENCOUNTER — Ambulatory Visit (INDEPENDENT_AMBULATORY_CARE_PROVIDER_SITE_OTHER): Payer: PPO

## 2020-05-27 VITALS — Ht 64.0 in | Wt 161.0 lb

## 2020-05-27 DIAGNOSIS — Z0000 Encounter for general adult medical examination without abnormal findings: Secondary | ICD-10-CM

## 2020-05-27 NOTE — Patient Instructions (Signed)
[name] , Thank you for taking time to complete your Medicare Wellness Visit. I appreciate your ongoing commitment to your health goals. Please review the following plan we discussed and let me know if I can assist you in the future.   Screening recommendations/referrals: Colonoscopy: Completed 04/04/2016-Due 04/04/2026 Mammogram: Completed 05/04/2020-Due 05/04/2021 Bone Density: Due-Declined today. Please call the office to schedule if you change your mind. Recommended yearly ophthalmology/optometry visit for glaucoma screening and checkup Recommended yearly dental visit for hygiene and checkup  Vaccinations: Influenza vaccine: Up to date Pneumococcal vaccine: Up to date-Prevnar-13 due 3/35/2023 Tdap vaccine: Up to date-Due 09/07/2020 Shingles vaccine: Completed vaccines   Covid-19:Completed vaccines  Advanced directives: Please bring a copy for your chart  Conditions/risks identified: See problem list  Next appointment: Follow up in one year for your annual wellness visit    Preventive Care 65 Years and Older, Female Preventive care refers to lifestyle choices and visits with your health care provider that can promote health and wellness. What does preventive care include?  A yearly physical exam. This is also called an annual well check.  Dental exams once or twice a year.  Routine eye exams. Ask your health care provider how often you should have your eyes checked.  Personal lifestyle choices, including:  Daily care of your teeth and gums.  Regular physical activity.  Eating a healthy diet.  Avoiding tobacco and drug use.  Limiting alcohol use.  Practicing safe sex.  Taking low-dose aspirin every day.  Taking vitamin and mineral supplements as recommended by your health care provider. What happens during an annual well check? The services and screenings done by your health care provider during your annual well check will depend on your age, overall health,  lifestyle risk factors, and family history of disease. Counseling  Your health care provider may ask you questions about your:  Alcohol use.  Tobacco use.  Drug use.  Emotional well-being.  Home and relationship well-being.  Sexual activity.  Eating habits.  History of falls.  Memory and ability to understand (cognition).  Work and work Statistician.  Reproductive health. Screening  You may have the following tests or measurements:  Height, weight, and BMI.  Blood pressure.  Lipid and cholesterol levels. These may be checked every 5 years, or more frequently if you are over 11 years old.  Skin check.  Lung cancer screening. You may have this screening every year starting at age 1 if you have a 30-pack-year history of smoking and currently smoke or have quit within the past 15 years.  Fecal occult blood test (FOBT) of the stool. You may have this test every year starting at age 87.  Flexible sigmoidoscopy or colonoscopy. You may have a sigmoidoscopy every 5 years or a colonoscopy every 10 years starting at age 31.  Hepatitis C blood test.  Hepatitis B blood test.  Sexually transmitted disease (STD) testing.  Diabetes screening. This is done by checking your blood sugar (glucose) after you have not eaten for a while (fasting). You may have this done every 1-3 years.  Bone density scan. This is done to screen for osteoporosis. You may have this done starting at age 25.  Mammogram. This may be done every 1-2 years. Talk to your health care provider about how often you should have regular mammograms. Talk with your health care provider about your test results, treatment options, and if necessary, the need for more tests. Vaccines  Your health care provider may recommend certain vaccines,  such as:  Influenza vaccine. This is recommended every year.  Tetanus, diphtheria, and acellular pertussis (Tdap, Td) vaccine. You may need a Td booster every 10 years.  Zoster  vaccine. You may need this after age 29.  Pneumococcal 13-valent conjugate (PCV13) vaccine. One dose is recommended after age 77.  Pneumococcal polysaccharide (PPSV23) vaccine. One dose is recommended after age 20. Talk to your health care provider about which screenings and vaccines you need and how often you need them. This information is not intended to replace advice given to you by your health care provider. Make sure you discuss any questions you have with your health care provider. Document Released: 02/20/2015 Document Revised: 10/14/2015 Document Reviewed: 11/25/2014 Elsevier Interactive Patient Education  2017 [place] Prevention in the Home Falls can cause injuries. They can happen to people of all ages. There are many things you can do to make your home safe and to help prevent falls. What can I do on the outside of my home?  Regularly fix the edges of walkways and driveways and fix any cracks.  Remove anything that might make you trip as you walk through a door, such as a raised step or threshold.  Trim any bushes or trees on the path to your home.  Use bright outdoor lighting.  Clear any walking paths of anything that might make someone trip, such as rocks or tools.  Regularly check to see if handrails are loose or broken. Make sure that both sides of any steps have handrails.  Any raised decks and porches should have guardrails on the edges.  Have any leaves, snow, or ice cleared regularly.  Use sand or salt on walking paths during winter.  Clean up any spills in your garage right away. This includes oil or grease spills. What can I do in the bathroom?  Use night lights.  Install grab bars by the toilet and in the tub and shower. Do not use towel bars as grab bars.  Use non-skid mats or decals in the tub or shower.  If you need to sit down in the shower, use a plastic, non-slip stool.  Keep the floor dry. Clean up any water that spills on the  floor as soon as it happens.  Remove soap buildup in the tub or shower regularly.  Attach bath mats securely with double-sided non-slip rug tape.  Do not have throw rugs and other things on the floor that can make you trip. What can I do in the bedroom?  Use night lights.  Make sure that you have a light by your bed that is easy to reach.  Do not use any sheets or blankets that are too big for your bed. They should not hang down onto the floor.  Have a firm chair that has side arms. You can use this for support while you get dressed.  Do not have throw rugs and other things on the floor that can make you trip. What can I do in the kitchen?  Clean up any spills right away.  Avoid walking on wet floors.  Keep items that you use a lot in easy-to-reach places.  If you need to reach something above you, use a strong step stool that has a grab bar.  Keep electrical cords out of the way.  Do not use floor polish or wax that makes floors slippery. If you must use wax, use non-skid floor wax.  Do not have throw rugs and other things on  the floor that can make you trip. What can I do with my stairs?  Do not leave any items on the stairs.  Make sure that there are handrails on both sides of the stairs and use them. Fix handrails that are broken or loose. Make sure that handrails are as long as the stairways.  Check any carpeting to make sure that it is firmly attached to the stairs. Fix any carpet that is loose or worn.  Avoid having throw rugs at the top or bottom of the stairs. If you do have throw rugs, attach them to the floor with carpet tape.  Make sure that you have a light switch at the top of the stairs and the bottom of the stairs. If you do not have them, ask someone to add them for you. What else can I do to help prevent falls?  Wear shoes that:  Do not have high heels.  Have rubber bottoms.  Are comfortable and fit you well.  Are closed at the toe. Do not wear  sandals.  If you use a stepladder:  Make sure that it is fully opened. Do not climb a closed stepladder.  Make sure that both sides of the stepladder are locked into place.  Ask someone to hold it for you, if possible.  Clearly mark and make sure that you can see:  Any grab bars or handrails.  First and last steps.  Where the edge of each step is.  Use tools that help you move around (mobility aids) if they are needed. These include:  Canes.  Walkers.  Scooters.  Crutches.  Turn on the lights when you go into a dark area. Replace any light bulbs as soon as they burn out.  Set up your furniture so you have a clear path. Avoid moving your furniture around.  If any of your floors are uneven, fix them.  If there are any pets around you, be aware of where they are.  Review your medicines with your doctor. Some medicines can make you feel dizzy. This can increase your chance of falling. Ask your doctor what other things that you can do to help prevent falls. This information is not intended to replace advice given to you by your health care provider. Make sure you discuss any questions you have with your health care provider. Document Released: 11/20/2008 Document Revised: 07/02/2015 Document Reviewed: 02/28/2014 Elsevier Interactive Patient Education  2017 [employer].

## 2020-05-27 NOTE — Progress Notes (Signed)
Subjective:   Olivia [name] is a 65 y.o. female who presents for an Initial Medicare Annual Wellness Visit.  I connected with Malikah today by telephone and verified that I am speaking with the correct person using two identifiers. Location patient: home Location provider: work Persons participating in the virtual visit: patient, Marine scientist.    I discussed the limitations, risks, security and privacy concerns of performing an evaluation and management service by telephone and the availability of in person appointments. I also discussed with the patient that there may be a patient responsible charge related to this service. The patient expressed understanding and verbally consented to this telephonic visit.    Interactive audio and video telecommunications were attempted between this provider and patient, however failed, due to patient having technical difficulties OR patient did not have access to video capability.  We continued and completed visit with audio only.  Some vital signs may be absent or patient reported.   Time Spent with patient on telephone encounter: 20 minutes   Review of Systems     Cardiac Risk Factors include: advanced age (>51mn, >>57women);dyslipidemia     Objective:    Today's Vitals   05/27/20 0821  Weight: 161 lb (73 kg)  Height: 5' 4"  (1.626 m)   Body mass index is 27.64 kg/m.  Advanced Directives 05/27/2020 11/27/2019 10/17/2019 04/29/2019  Does Patient Have a Medical Advance Directive? No;Yes No Yes No  Type of AParamedicof APastosLiving will - - -  Copy of HTusculumin Chart? No - copy requested - - -  Would patient like information on creating a medical advance directive? - - - No - Patient declined    Current Medications (verified) Outpatient Encounter Medications as of 05/27/2020  Medication Sig  . aspirin-acetaminophen-caffeine (EXCEDRIN MIGRAINE) 250-250-65 MG per tablet Take 1 tablet by mouth  every 6 (six) hours as needed.  . blood glucose meter kit and supplies Dispense based on patient and insurance preference. Use up to four times daily(FOR ICD-10 E10.9, E11.9).  .Marland KitchenCholecalciferol (VITAMIN D HIGH POTENCY PO) Take by mouth. Per pt taking 10,000 units daily  . Multiple Vitamins-Minerals (IMMUNE SUPPORT PO) Take 2 capsules by mouth daily.  . naproxen sodium (ALEVE) 220 MG tablet Take 220 mg by mouth.   No facility-administered encounter medications on file as of 05/27/2020.    Allergies (verified) Patient has no known allergies.   History: Past Medical History:  Diagnosis Date  . Pre-diabetes    History reviewed. No pertinent surgical history. Family History  Problem Relation Age of Onset  . Arthritis Mother   . Dementia Mother   . Heart disease Father   . Stroke Father   . Hypertension Father   . Diabetes Father   . Cancer Father        liver   Social History   Socioeconomic History  . Marital status: Married    Spouse name: Not on file  . Number of children: 2  . Years of education: Not on file  . Highest education level: Not on file  Occupational History  . Occupation: dChartered certified accountant [place]   Tobacco Use  . Smoking status: Never Smoker  . Smokeless tobacco: Never Used  Vaping Use  . Vaping Use: Never used  Substance and Sexual Activity  . Alcohol use: Yes    Alcohol/week: 2.0 standard drinks    Types: 2 Standard drinks or  equivalent per week    Comment: Once a week  . Drug use: No  . Sexual activity: Yes    Partners: Male  Other Topics Concern  . Not on file  Social History Narrative   Exercise---  3-4 x a week   Right Handed   Lives in a two story home   Drinks no caffeine    Social Determinants of Health   Financial Resource Strain: Low Risk   . Difficulty of Paying Living Expenses: Not hard at all  Food Insecurity: No Food Insecurity  . Worried About Charity fundraiser in the Last Year: Never true   . Ran Out of Food in the Last Year: Never true  Transportation Needs: No Transportation Needs  . Lack of Transportation (Medical): No  . Lack of Transportation (Non-Medical): No  Physical Activity: Sufficiently Active  . Days of Exercise per Week: 7 days  . Minutes of Exercise per Session: 60 min  Stress: No Stress Concern Present  . Feeling of Stress : Not at all  Social Connections: Moderately Integrated  . Frequency of Communication with Friends and Family: More than three times a week  . Frequency of Social Gatherings with Friends and Family: More than three times a week  . Attends Religious Services: Never  . Active Member of Clubs or Organizations: Yes  . Attends Archivist Meetings: 1 to 4 times per year  . Marital Status: Married    Tobacco Counseling Counseling given: Not Answered   Clinical Intake:  Pre-visit preparation completed: Yes  Pain : No/denies pain     Nutritional Status: BMI 25 -29 Overweight Nutritional Risks: None Diabetes: No  How often do you need to have someone help you when you read instructions, pamphlets, or other written materials from your doctor or pharmacy?: 1 - Never  Diabetic?No  Interpreter Needed?: No  Information entered by :: Caroleen Hamman LPN   Activities of Daily Living In your present state of health, do you have any difficulty performing the following activities: 05/27/2020 05/01/2020  Hearing? N N  Vision? N N  Difficulty concentrating or making decisions? N N  Walking or climbing stairs? N N  Dressing or bathing? N N  Doing errands, shopping? N N  Preparing Food and eating ? N -  Using the Toilet? N -  In the past six months, have you accidently leaked urine? N -  Do you have problems with loss of bowel control? N -  Managing your Medications? N -  Managing your Finances? N -  Housekeeping or managing your Housekeeping? N -  Some recent data might be hidden    Patient Care Team: Carollee Herter, Alferd Apa, DO as PCP - General (Family Medicine) Berniece Salines, DO as PCP - Cardiology (Cardiology) de Zenia Resides, [place], [place] (Optometry) Lake Bells., MD as Referring Physician (Gastroenterology) Pieter Partridge, DO as Consulting Physician (Neurology)  Indicate any recent Medical Services you may have received from other than [facility] providers in the past year (date may be approximate).     Assessment:   This is a routine wellness examination for [place].  Hearing/Vision screen  Hearing Screening   125Hz  250Hz  500Hz  1000Hz  2000Hz  3000Hz  4000Hz  6000Hz  8000Hz   Right ear:           Left ear:           Comments: No issues  Vision Screening Comments: Wears contacts Last eye exam-2020-Dr. DeAllen  Dietary issues and exercise activities discussed:  Current Exercise Habits: Home exercise routine, Type of exercise: strength training/weights;walking;Other - see comments (swimming, kayaking), Time (Minutes): 60, Frequency (Times/Week): 7, Weekly Exercise (Minutes/Week): 420, Intensity: Moderate, Exercise limited by: None identified  Goals    . Patient Stated     Increase activity      Depression Screen PHQ 2/9 Scores 05/27/2020 05/01/2020 04/29/2019 04/21/2017 02/15/2016 11/21/2012  PHQ - 2 Score 0 0 0 0 0 1    Fall Risk Fall Risk  05/27/2020 11/27/2019 04/29/2019 04/29/2019 04/21/2017  Falls in the past year? 0 1 0 0 No  Number falls in past yr: 0 0 0 - -  Injury with Fall? 0 0 0 - -  Follow up Falls prevention discussed - Falls evaluation completed Falls evaluation completed;Education provided;Falls prevention discussed -    FALL RISK PREVENTION PERTAINING TO THE HOME:  Any stairs in or around the home? Yes  If so, are there any without handrails? No  Home free of loose throw rugs in walkways, pet beds, electrical cords, etc? Yes  Adequate lighting in your home to reduce risk of falls? Yes   ASSISTIVE DEVICES UTILIZED TO PREVENT FALLS:  Life alert? No  Use of a cane, walker or w/c? No  Grab bars in  the bathroom? No  Shower chair or bench in shower? No  Elevated toilet seat or a handicapped toilet? No   TIMED UP AND GO:  Was the test performed? No . Phone visit   Cognitive Function:Normal cognitive status assessed by this Nurse Health Advisor. No abnormalities found.   MMSE - Mini Mental State Exam 04/29/2019  Orientation to time 5  Orientation to Place 5  Registration 3  Attention/ Calculation 5  Recall 3  Language- name 2 objects 2  Language- repeat 1  Language- follow 3 step command 3  Language- read & follow direction 1  Write a sentence 1  Copy design 1  Total score 30        Immunizations Immunization History  Administered Date(s) Administered  . Influenza, Seasonal, Injecte, Preservative Fre 11/19/2011  . Influenza,inj,Quad PF,6+ Mos 11/02/2012, 11/10/2014  . Influenza-Unspecified 11/27/2015, 11/04/2016, 10/09/2018  . PFIZER(Purple Top)SARS-COV-2 Vaccination 02/16/2019, 03/07/2019, 12/29/2019  . Pneumococcal Polysaccharide-23 05/01/2020  . Tdap 09/08/2010  . Zoster Recombinat (Shingrix) 10/31/2017, 03/02/2018    TDAP status: Up to date  Flu Vaccine status: Due, Education has been provided regarding the importance of this vaccine. Advised may receive this vaccine at local pharmacy or Health Dept. Aware to provide a copy of the vaccination record if obtained from local pharmacy or Health Dept. Verbalized acceptance and understanding.  Pneumococcal vaccine status: Up to date  Covid-19 vaccine status: Completed vaccines  Qualifies for Shingles Vaccine? No   Zostavax completed No   Shingrix Completed?: Yes  Screening Tests Health Maintenance  Topic Date Due  . URINE MICROALBUMIN  11/21/2013  . INFLUENZA VACCINE  09/07/2020  . TETANUS/TDAP  09/07/2020  . PNA vac Low Risk Adult (2 of 2 - PCV13) 05/01/2021  . MAMMOGRAM  05/04/2021  . COLONOSCOPY (Pts 45-46yr Insurance coverage will need to be confirmed)  04/04/2026  . DEXA SCAN  Completed  . COVID-19  Vaccine  Completed  . Hepatitis C Screening  Completed  . HPV VACCINES  Aged Out    Health Maintenance  Health Maintenance Due  Topic Date Due  . URINE MICROALBUMIN  11/21/2013    Colorectal cancer screening: Type of screening: Colonoscopy. Completed 04/04/2016. Repeat every 10 years  Mammogram status: Completed Bilateral  05/04/2020. Repeat every year  Bone Density status: Due-Declined today.  Lung Cancer Screening: (Low Dose CT Chest recommended if Age 71-80 years, 30 pack-year currently smoking OR have quit w/in 15years.) does not qualify.    Additional Screening:  Hepatitis C Screening:  Completed 02/16/2016  Vision Screening: Recommended annual ophthalmology exams for early detection of glaucoma and other disorders of the eye. Is the patient up to date with their annual eye exam?  No  Who is the provider or what is the name of the office in which the patient attends annual eye exams? Dr. Isac Caddy Patient plans to make an appt soon  Dental Screening: Recommended annual dental exams for proper oral hygiene  Community Resource Referral / Chronic Care Management: CRR required this visit?  No   CCM required this visit?  No      Plan:     I have personally reviewed and noted the following in the patient's chart:   . Medical and social history . Use of alcohol, tobacco or illicit drugs  . Current medications and supplements . Functional ability and status . Nutritional status . Physical activity . Advanced directives . List of other physicians . Hospitalizations, surgeries, and ER visits in previous 12 months . Vitals . Screenings to include cognitive, depression, and falls . Referrals and appointments  In addition, I have reviewed and discussed with patient certain preventive protocols, quality metrics, and best practice recommendations. A written personalized care plan for preventive services as well as general preventive health recommendations were provided to  patient.   Due to this being a telephonic visit, the after visit summary with patients personalized plan was offered to patient via mail or my-chart.  Patient would like to access on my-chart.   Marta Antu, LPN   5/57/3220  Nurse Health Advisor  Nurse Notes: None

## 2020-06-15 NOTE — Progress Notes (Signed)
[facility] [address] [place] Phone: [phone] Subjective:   I Olivia [name] am serving as a Education administrator for Dr. Hulan Saas.  This visit occurred during the SARS-CoV-2 public health emergency.  Safety protocols were in place, including screening questions prior to the visit, additional usage of staff PPE, and extensive cleaning of exam room while observing appropriate contact time as indicated for disinfecting solutions.   I'm seeing this patient by the request  of:  Ann Held, DO  CC: Left shoulder pain follow-up  [id number]   04/27/2020 Patient is doing much better at this time.  No need to make any significant changes.  Patient is to continue the home exercises.  Worsening pain can consider injection but do not think that advanced imaging would be warranted at this time  Patient seems to be doing better.  Trigger nodule is still noted on exam but not giving her true triggering at the moment.  We discussed we could potentially consider the possibility of injections if necessary otherwise patient should do relatively well.  Update 06/16/2020 Olivia [name] is a 65 y.o. female coming in with complaint of L shoulder and L thumb pain. Patient states she is not 100% but doing better. Better ROM. Has a few questions today.  Patient states that she has not taken any type of medications.  Only notices it with some mild posterior pain on the shoulder with certain movements.  Still has tightness on the left side compared to the contralateral side.  Still seems to be doing better slowly.     Past Medical History:  Diagnosis Date  . Pre-diabetes    No past surgical history on file. Social History   Socioeconomic History  . Marital status: Married    Spouse name: Not on file  . Number of children: 2  . Years of education: Not on file  . Highest education level: Not on file  Occupational History  . 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 Smoking status: Never Smoker  . Smokeless tobacco: Never Used  Vaping Use  . Vaping Use: Never used  Substance and Sexual Activity  . Alcohol use: Yes    Alcohol/week: 2.0 standard drinks    Types: 2 Standard drinks or equivalent per week    Comment: Once a week  . Drug use: No  . Sexual activity: Yes    Partners: Male  Other Topics Concern  . Not on file  Social History Narrative   Exercise---  3-4 x a week   Right Handed   Lives in a two story home   Drinks no caffeine    Social Determinants of Health   Financial Resource Strain: Low Risk   . Difficulty of Paying Living Expenses: Not hard at all  Food Insecurity: No Food Insecurity  . Worried About Charity fundraiser in the Last Year: Never true  . Ran Out of Food in the Last Year: Never true  Transportation Needs: No Transportation Needs  . Lack of Transportation (Medical): No  . Lack of Transportation (Non-Medical): No  Physical Activity: Sufficiently Active  . Days of Exercise per Week: 7 days  . Minutes of Exercise per Session: 60 min  Stress: No Stress Concern Present  . Feeling of Stress : Not at all  Social Connections: Moderately Integrated  . Frequency of Communication with Friends and Family: More  than three times a week  . Frequency of Social Gatherings with Friends and Family: More than three times a week  . Attends Religious Services: Never  . Active Member of Clubs or Organizations: Yes  . Attends Archivist Meetings: 1 to 4 times per year  . Marital Status: Married   No Known Allergies Family History  Problem Relation Age of Onset  . Arthritis Mother   . Dementia Mother   . Heart disease Father   . Stroke Father   . Hypertension Father   . Diabetes Father   . Cancer Father        liver       Current Outpatient Medications (Analgesics):  .  aspirin-acetaminophen-caffeine (EXCEDRIN MIGRAINE) 003-491-79 MG per tablet,  Take 1 tablet by mouth every 6 (six) hours as needed. .  naproxen sodium (ALEVE) 220 MG tablet, Take 220 mg by mouth.   Current Outpatient Medications (Other):  .  blood glucose meter kit and supplies, Dispense based on patient and insurance preference. Use up to four times daily(FOR ICD-10 E10.9, E11.9). Marland Kitchen  Cholecalciferol (VITAMIN D HIGH POTENCY PO), Take by mouth. Per pt taking 10,000 units daily .  Multiple Vitamins-Minerals (IMMUNE SUPPORT PO), Take 2 capsules by mouth daily.   Reviewed prior external information including notes and imaging from  primary care provider As well as notes that were available from care everywhere and other healthcare systems.  Past medical history, social, surgical and family history all reviewed in electronic medical record.  No pertanent information unless stated regarding to the chief complaint.   Review of Systems:  No headache, visual changes, nausea, vomiting, diarrhea, constipation, dizziness, abdominal pain, skin rash, fevers, chills, night sweats, weight loss, swollen lymph nodes,  joint swelling, chest pain, shortness of breath, mood changes.   Objective  Blood pressure 112/78, pulse 75, height _0  (1.626 m), weight 163 lb (73.9 kg), SpO2 97 %.   General: No apparent distress alert and oriented x3 mood and affect normal, dressed appropriately.  HEENT: Pupils equal, extraocular movements intact  Respiratory: Patient's speak in full sentences and does not appear short of breath  Cardiovascular: No lower extremity edema, non tender, no erythema  Gait normal with good balance and coordination.  MSK: Left shoulder exam still lacks the last 10 degrees of external rotation noted.  Patient does have tightness with internal rotation somewhat.  5 out of 5 strength all noted of the rotator cuff.     Impression and Recommendations:     The above documentation has been reviewed and is accurate and complete Olivia Pulley, DO

## 2020-06-16 ENCOUNTER — Ambulatory Visit: Payer: PPO | Admitting: Family Medicine

## 2020-06-16 ENCOUNTER — Other Ambulatory Visit: Payer: Self-pay

## 2020-06-16 ENCOUNTER — Encounter: Payer: Self-pay | Admitting: Family Medicine

## 2020-06-16 DIAGNOSIS — M65312 Trigger thumb, left thumb: Secondary | ICD-10-CM

## 2020-06-16 DIAGNOSIS — M25512 Pain in left shoulder: Secondary | ICD-10-CM

## 2020-06-16 NOTE — Patient Instructions (Addendum)
Good to see you [facility] Overall stay active and continue what you are doing Use the band to loosen up  Send me dates for when we will be on the water See me again as needed

## 2020-06-16 NOTE — Assessment & Plan Note (Signed)
At this time does have the nodule still noted.  Patient wants to continue with conservative therapy.  More significant to the injection.

## 2020-06-16 NOTE — Assessment & Plan Note (Signed)
I believe the patient has more of a bursitis with possible labral pathology.  We discussed with patient about the possibility of injections and patient declined.  We discussed with patient continue home exercises and the icing regimen.  Patient has noticed that if she starts swimming and does seem to feel better overall.  Patient will do more the exercises as a warm up with certain activities.  As long as patient does fine the patient will follow-up as needed

## 2020-10-27 DIAGNOSIS — H2513 Age-related nuclear cataract, bilateral: Secondary | ICD-10-CM | POA: Diagnosis not present

## 2020-11-02 ENCOUNTER — Ambulatory Visit: Payer: PPO | Admitting: Family Medicine

## 2020-11-10 ENCOUNTER — Other Ambulatory Visit: Payer: Self-pay

## 2020-11-10 ENCOUNTER — Encounter: Payer: Self-pay | Admitting: Family Medicine

## 2020-11-10 ENCOUNTER — Ambulatory Visit (INDEPENDENT_AMBULATORY_CARE_PROVIDER_SITE_OTHER): Payer: PPO | Admitting: Family Medicine

## 2020-11-10 VITALS — BP 122/70 | HR 83 | Temp 98.7°F | Resp 18 | Ht 64.0 in | Wt 162.2 lb

## 2020-11-10 DIAGNOSIS — E782 Mixed hyperlipidemia: Secondary | ICD-10-CM

## 2020-11-10 DIAGNOSIS — E8881 Metabolic syndrome: Secondary | ICD-10-CM | POA: Diagnosis not present

## 2020-11-10 DIAGNOSIS — E785 Hyperlipidemia, unspecified: Secondary | ICD-10-CM

## 2020-11-10 DIAGNOSIS — R7303 Prediabetes: Secondary | ICD-10-CM

## 2020-11-10 DIAGNOSIS — Z23 Encounter for immunization: Secondary | ICD-10-CM

## 2020-11-10 LAB — COMPREHENSIVE METABOLIC PANEL
ALT: 18 U/L (ref 0–35)
AST: 18 U/L (ref 0–37)
Albumin: 4.5 g/dL (ref 3.5–5.2)
Alkaline Phosphatase: 83 U/L (ref 39–117)
BUN: 12 mg/dL (ref 6–23)
CO2: 29 mEq/L (ref 19–32)
Calcium: 9.7 mg/dL (ref 8.4–10.5)
Chloride: 100 mEq/L (ref 96–112)
Creatinine, Ser: 0.75 mg/dL (ref 0.40–1.20)
GFR: 82.75 mL/min (ref >60.00)
Glucose, Bld: 86 mg/dL (ref 70–99)
Potassium: 4.3 mEq/L (ref 3.5–5.1)
Sodium: 138 mEq/L (ref 135–145)
Total Bilirubin: 0.5 mg/dL (ref 0.2–1.2)
Total Protein: 7.3 g/dL (ref 6.0–8.3)

## 2020-11-10 LAB — LIPID PANEL
Cholesterol: 236 mg/dL — ABNORMAL HIGH (ref 0–200)
HDL: 60.8 mg/dL (ref >39.00)
LDL Cholesterol: 143 mg/dL — ABNORMAL HIGH (ref 0–99)
NonHDL: 175.65
Total CHOL/HDL Ratio: 4
Triglycerides: 164 mg/dL — ABNORMAL HIGH (ref 0.0–149.0)
VLDL: 32.8 mg/dL (ref 0.0–40.0)

## 2020-11-10 LAB — HEMOGLOBIN A1C: Hgb A1c MFr Bld: 6 % (ref 4.6–6.5)

## 2020-11-10 NOTE — Assessment & Plan Note (Signed)
Recheck labs today con't with diet and exercise

## 2020-11-10 NOTE — Progress Notes (Signed)
Subjective:   By signing my name below, I, Shehryar Baig, attest that this documentation has been prepared under the direction and in the presence of Dr. Roma Schanz, DO. 11/10/2020    Patient ID: Olivia [name], female    DOB: [birth year], 65 y.o.   MRN: [id number]  Chief Complaint  Patient presents with   Hyperlipidemia   Follow-up    HPI Patient is in today for a office visit. Her insulin levels were elevated during her last lab work. She intermittently checks her blood sugars. She reports they stay around 90.   Lab Results  Component Value Date   HGBA1C 6.0 05/01/2020   She is participating in regular exercise by paddling boats.  She is receiving a flu vaccine during this visit. She is due for a tetanus vaccine and is interested in getting it at her pharmacy after this visit. She has 3 pfizer Covid-19 vaccines. She was informed of the new Covid-19 vaccines during this visit.    Past Medical History:  Diagnosis Date   Pre-diabetes     No past surgical history on file.  Family History  Problem Relation Age of Onset   Arthritis Mother    Dementia Mother    Heart disease Father    Stroke Father    Hypertension Father    Diabetes Father    Cancer Father        liver    Social History   Socioeconomic History   Marital status: Married    Spouse name: Not on file   Number of children: 2   Years of education: Not on file   Highest education level: Not on file  Occupational History   Occupation: dietician    Employer: [employer]    Comment: retired   Tobacco Use   Smoking status: Never   Smokeless tobacco: Never  Vaping Use   Vaping Use: Never used  Substance and Sexual Activity   Alcohol use: Yes    Alcohol/week: 2.0 standard drinks    Types: 2 Standard drinks or equivalent per week    Comment: Once a week   Drug use: No   Sexual activity: Yes    Partners: Male  Other Topics Concern   Not on file  Social History Narrative   Exercise---   3-4 x a week   Right Handed   Lives in a two story home   Drinks no caffeine    Social Determinants of Health   Financial Resource Strain: Low Risk    Difficulty of Paying Living Expenses: Not hard at all  Food Insecurity: No Food Insecurity   Worried About Charity fundraiser in the Last Year: Never true   Arboriculturist in the Last Year: Never true  Transportation Needs: No Transportation Needs   Lack of Transportation (Medical): No   Lack of Transportation (Non-Medical): No  Physical Activity: Sufficiently Active   Days of Exercise per Week: 7 days   Minutes of Exercise per Session: 60 min  Stress: No Stress Concern Present   Feeling of Stress : Not at all  Social Connections: Moderately Integrated   Frequency of Communication with Friends and Family: More than three times a week   Frequency of Social Gatherings with Friends and Family: More than three times a week   Attends Religious Services: Never   Marine scientist or Organizations: Yes   Attends Archivist Meetings: 1 to 4 times per year   Marital  Status: Married  Human resources officer Violence: Not At Risk   Fear of Current or Ex-Partner: No   Emotionally Abused: No   Physically Abused: No   Sexually Abused: No    Outpatient Medications Prior to Visit  Medication Sig Dispense Refill   aspirin-acetaminophen-caffeine (EXCEDRIN MIGRAINE) 250-250-65 MG per tablet Take 1 tablet by mouth every 6 (six) hours as needed.     blood glucose meter kit and supplies Dispense based on patient and insurance preference. Use up to four times daily(FOR ICD-10 E10.9, E11.9). 1 each 0   Cholecalciferol (VITAMIN D HIGH POTENCY PO) Take by mouth. Per pt taking 10,000 units daily     Multiple Vitamins-Minerals (IMMUNE SUPPORT PO) Take 2 capsules by mouth daily.     naproxen sodium (ALEVE) 220 MG tablet Take 220 mg by mouth.     No facility-administered medications prior to visit.    No Known Allergies  Review of Systems   Constitutional:  Negative for fever and malaise/fatigue.  HENT:  Negative for congestion.   Eyes:  Negative for blurred vision.  Respiratory:  Negative for shortness of breath.   Cardiovascular:  Negative for chest pain, palpitations and leg swelling.  Gastrointestinal:  Negative for abdominal pain, blood in stool and nausea.  Genitourinary:  Negative for dysuria and frequency.  Musculoskeletal:  Negative for falls.  Skin:  Negative for rash.  Neurological:  Negative for dizziness, loss of consciousness and headaches.  Endo/Heme/Allergies:  Negative for environmental allergies.  Psychiatric/Behavioral:  Negative for depression. The patient is not nervous/anxious.       Objective:    Physical Exam Vitals and nursing note reviewed.  Constitutional:      General: She is not in acute distress.    Appearance: Normal appearance. She is not ill-appearing.  HENT:     Head: Normocephalic and atraumatic.     Right Ear: External ear normal.     Left Ear: External ear normal.  Eyes:     Extraocular Movements: Extraocular movements intact.     Pupils: Pupils are equal, round, and reactive to light.  Cardiovascular:     Rate and Rhythm: Normal rate and regular rhythm.     Heart sounds: Normal heart sounds. No murmur heard.   No gallop.  Pulmonary:     Effort: Pulmonary effort is normal. No respiratory distress.     Breath sounds: Normal breath sounds. No wheezing or rales.  Skin:    General: Skin is warm and dry.  Neurological:     Mental Status: She is alert and oriented to person, place, and time.  Psychiatric:        Behavior: Behavior normal.        Judgment: Judgment normal.    BP 122/70 (BP Location: Left Arm, Patient Position: Sitting, Cuff Size: Normal)   Pulse 83   Temp 98.7 F (37.1 C) (Oral)   Resp 18   Ht 5' 4"  (1.626 m)   Wt 162 lb 3.2 oz (73.6 kg)   SpO2 97%   BMI 27.84 kg/m  Wt Readings from Last 3 Encounters:  11/10/20 162 lb 3.2 oz (73.6 kg)  06/16/20 163  lb (73.9 kg)  05/27/20 161 lb (73 kg)    Diabetic Foot Exam - Simple   No data filed    Lab Results  Component Value Date   WBC 7.1 04/21/2020   HGB 13.6 04/21/2020   HCT 39.4 04/21/2020   PLT 251.0 04/21/2020   GLUCOSE 96 04/21/2020   CHOL  227 (H) 04/21/2020   TRIG 178.0 (H) 04/21/2020   HDL 63.00 04/21/2020   LDLDIRECT 164.5 11/21/2012   LDLCALC 128 (H) 04/21/2020   ALT 17 04/21/2020   AST 16 04/21/2020   NA 139 04/21/2020   K 4.1 04/21/2020   CL 101 04/21/2020   CREATININE 0.78 04/21/2020   BUN 12 04/21/2020   CO2 29 04/21/2020   TSH 2.98 04/23/2018   INR 1.0 10/17/2019   HGBA1C 6.0 05/01/2020   MICROALBUR 0.1 11/21/2012    Lab Results  Component Value Date   TSH 2.98 04/23/2018   Lab Results  Component Value Date   WBC 7.1 04/21/2020   HGB 13.6 04/21/2020   HCT 39.4 04/21/2020   MCV 86.4 04/21/2020   PLT 251.0 04/21/2020   Lab Results  Component Value Date   NA 139 04/21/2020   K 4.1 04/21/2020   CO2 29 04/21/2020   GLUCOSE 96 04/21/2020   BUN 12 04/21/2020   CREATININE 0.78 04/21/2020   BILITOT 0.3 04/21/2020   ALKPHOS 79 04/21/2020   AST 16 04/21/2020   ALT 17 04/21/2020   PROT 7.2 04/21/2020   ALBUMIN 4.3 04/21/2020   CALCIUM 9.6 04/21/2020   ANIONGAP 10 10/17/2019   GFR 79.25 04/21/2020   Lab Results  Component Value Date   CHOL 227 (H) 04/21/2020   Lab Results  Component Value Date   HDL 63.00 04/21/2020   Lab Results  Component Value Date   LDLCALC 128 (H) 04/21/2020   Lab Results  Component Value Date   TRIG 178.0 (H) 04/21/2020   Lab Results  Component Value Date   CHOLHDL 4 04/21/2020   Lab Results  Component Value Date   HGBA1C 6.0 05/01/2020       Assessment & Plan:   Problem List Items Addressed This Visit       Unprioritized   Mixed hyperlipidemia    Encourage heart healthy diet such as MIND or DASH diet, increase exercise, avoid trans fats, simple carbohydrates and processed foods, consider a krill or  fish or flaxseed oil cap daily.       Pre-diabetes    Recheck labs today con't with diet and exercise      Other Visit Diagnoses     Hyperlipidemia, unspecified hyperlipidemia type    -  Primary   Relevant Orders   Lipid panel   Need for influenza vaccination       Relevant Orders   Flu Vaccine QUAD High Dose(Fluad) (Completed)   Insulin resistance       Relevant Orders   Hemoglobin A1c   Comprehensive metabolic panel   Insulin, random        No orders of the defined types were placed in this encounter.   I, Dr. Roma Schanz, DO, personally preformed the services described in this documentation.  All medical record entries made by the scribe were at my direction and in my presence.  I have reviewed the chart and discharge instructions (if applicable) and agree that the record reflects my personal performance and is accurate and complete. 11/10/2020   I,Shehryar Baig,acting as a scribe for Ann Held, DO.,have documented all relevant documentation on the behalf of Ann Held, DO,as directed by  Ann Held, DO while in the presence of Ann Held, DO.   Ann Held, DO

## 2020-11-10 NOTE — Assessment & Plan Note (Signed)
Encourage heart healthy diet such as MIND or DASH diet, increase exercise, avoid trans fats, simple carbohydrates and processed foods, consider a krill or fish or flaxseed oil cap daily.  °

## 2020-11-10 NOTE — Patient Instructions (Signed)
Carbohydrate Counting for Diabetes Mellitus, Adult Carbohydrate counting is a method of keeping track of how many carbohydrates you eat. Eating carbohydrates naturally increases the amount of sugar (glucose) in the blood. Counting how many carbohydrates you eat improves your blood glucose control, which helps you manage your diabetes. It is important to know how many carbohydrates you can safely have in each meal. This is different for every person. A dietitian can help you make a meal plan and calculate how many carbohydrates you should have at each meal and snack. What foods contain carbohydrates? Carbohydrates are found in the following foods: Grains, such as breads and cereals. Dried beans and soy products. Starchy vegetables, such as potatoes, peas, and corn. Fruit and fruit juices. Milk and yogurt. Sweets and snack foods, such as cake, cookies, candy, chips, and soft drinks. How do I count carbohydrates in foods? There are two ways to count carbohydrates in food. You can read food labels or learn standard serving sizes of foods. You can use either of the methods or a combination of both. Using the Nutrition Facts label The Nutrition Facts list is included on the labels of almost all packaged foods and beverages in the [place] It includes: The serving size. Information about nutrients in each serving, including the grams (g) of carbohydrate per serving. To use the Nutrition Facts: Decide how many servings you will have. Multiply the number of servings by the number of carbohydrates per serving. The resulting number is the total amount of carbohydrates that you will be having. Learning the standard serving sizes of foods When you eat carbohydrate foods that are not packaged or do not include Nutrition Facts on the label, you need to measure the servings in order to count the amount of carbohydrates. Measure the foods that you will eat with a food scale or measuring cup, if needed. Decide how  many standard-size servings you will eat. Multiply the number of servings by 15. For foods that contain carbohydrates, one serving equals 15 g of carbohydrates. For example, if you eat 2 cups or 10 oz (300 g) of strawberries, you will have eaten 2 servings and 30 g of carbohydrates (2 servings x 15 g = 30 g). For foods that have more than one food mixed, such as soups and casseroles, you must count the carbohydrates in each food that is included. The following list contains standard serving sizes of common carbohydrate-rich foods. Each of these servings has about 15 g of carbohydrates: 1 slice of bread. 1 six-inch (15 cm) tortilla. ? cup or 2 oz (53 g) cooked rice or pasta.  cup or 3 oz (85 g) cooked or canned, drained and rinsed beans or lentils.  cup or 3 oz (85 g) starchy vegetable, such as peas, corn, or squash.  cup or 4 oz (120 g) hot cereal.  cup or 3 oz (85 g) boiled or mashed potatoes, or  or 3 oz (85 g) of a large baked potato.  cup or 4 fl oz (118 mL) fruit juice. 1 cup or 8 fl oz (237 mL) milk. 1 small or 4 oz (106 g) apple.  or 2 oz (63 g) of a medium banana. 1 cup or 5 oz (150 g) strawberries. 3 cups or 1 oz (24 g) popped popcorn. What is an example of carbohydrate counting? To calculate the number of carbohydrates in this sample meal, follow the steps shown below. Sample meal 3 oz (85 g) chicken breast. ? cup or 4 oz (106 g) brown   rice.  cup or 3 oz (85 g) corn. 1 cup or 8 fl oz (237 mL) milk. 1 cup or 5 oz (150 g) strawberries with sugar-free whipped topping. Carbohydrate calculation Identify the foods that contain carbohydrates: Rice. Corn. Milk. Strawberries. Calculate how many servings you have of each food: 2 servings rice. 1 serving corn. 1 serving milk. 1 serving strawberries. Multiply each number of servings by 15 g: 2 servings rice x 15 g = 30 g. 1 serving corn x 15 g = 15 g. 1 serving milk x 15 g = 15 g. 1 serving strawberries x 15 g = 15  g. Add together all of the amounts to find the total grams of carbohydrates eaten: 30 g + 15 g + 15 g + 15 g = 75 g of carbohydrates total. What are tips for following this plan? Shopping Develop a meal plan and then make a shopping list. Buy fresh and frozen vegetables, fresh and frozen fruit, dairy, eggs, beans, lentils, and whole grains. Look at food labels. Choose foods that have more fiber and less sugar. Avoid processed foods and foods with added sugars. Meal planning Aim to have the same amount of carbohydrates at each meal and for each snack time. Plan to have regular, balanced meals and snacks. Where to find more information American Diabetes Association: www.diabetes.org Centers for Disease Control and Prevention: www.cdc.gov Summary Carbohydrate counting is a method of keeping track of how many carbohydrates you eat. Eating carbohydrates naturally increases the amount of sugar (glucose) in the blood. Counting how many carbohydrates you eat improves your blood glucose control, which helps you manage your diabetes. A dietitian can help you make a meal plan and calculate how many carbohydrates you should have at each meal and snack. This information is not intended to replace advice given to you by your health care provider. Make sure you discuss any questions you have with your health care provider. Document Revised: 01/24/2019 Document Reviewed: 01/25/2019 Elsevier Patient Education  2021 Elsevier Inc.  

## 2020-11-11 LAB — INSULIN, RANDOM: Insulin: 6.9 u[IU]/mL

## 2020-12-18 DIAGNOSIS — H2513 Age-related nuclear cataract, bilateral: Secondary | ICD-10-CM | POA: Diagnosis not present

## 2020-12-18 DIAGNOSIS — H18413 Arcus senilis, bilateral: Secondary | ICD-10-CM | POA: Diagnosis not present

## 2020-12-18 DIAGNOSIS — H25013 Cortical age-related cataract, bilateral: Secondary | ICD-10-CM | POA: Diagnosis not present

## 2020-12-18 DIAGNOSIS — H2511 Age-related nuclear cataract, right eye: Secondary | ICD-10-CM | POA: Diagnosis not present

## 2020-12-18 DIAGNOSIS — H25043 Posterior subcapsular polar age-related cataract, bilateral: Secondary | ICD-10-CM | POA: Diagnosis not present

## 2021-02-26 DIAGNOSIS — H2512 Age-related nuclear cataract, left eye: Secondary | ICD-10-CM | POA: Diagnosis not present

## 2021-02-26 DIAGNOSIS — H2511 Age-related nuclear cataract, right eye: Secondary | ICD-10-CM | POA: Diagnosis not present

## 2021-02-26 HISTORY — PX: CATARACT EXTRACTION: SUR2

## 2021-04-04 DIAGNOSIS — R070 Pain in throat: Secondary | ICD-10-CM | POA: Diagnosis not present

## 2021-04-21 HISTORY — PX: CATARACT EXTRACTION: SUR2

## 2021-05-18 ENCOUNTER — Ambulatory Visit (INDEPENDENT_AMBULATORY_CARE_PROVIDER_SITE_OTHER): Payer: PPO | Admitting: Family Medicine

## 2021-05-18 ENCOUNTER — Encounter: Payer: Self-pay | Admitting: Family Medicine

## 2021-05-18 ENCOUNTER — Other Ambulatory Visit (HOSPITAL_BASED_OUTPATIENT_CLINIC_OR_DEPARTMENT_OTHER): Payer: Self-pay | Admitting: Family Medicine

## 2021-05-18 VITALS — BP 120/74 | HR 71 | Temp 98.7°F | Resp 16 | Ht 64.0 in | Wt 163.6 lb

## 2021-05-18 DIAGNOSIS — Z0000 Encounter for general adult medical examination without abnormal findings: Secondary | ICD-10-CM

## 2021-05-18 DIAGNOSIS — E2839 Other primary ovarian failure: Secondary | ICD-10-CM

## 2021-05-18 DIAGNOSIS — E785 Hyperlipidemia, unspecified: Secondary | ICD-10-CM | POA: Diagnosis not present

## 2021-05-18 DIAGNOSIS — R739 Hyperglycemia, unspecified: Secondary | ICD-10-CM

## 2021-05-18 DIAGNOSIS — Z1231 Encounter for screening mammogram for malignant neoplasm of breast: Secondary | ICD-10-CM

## 2021-05-18 LAB — CBC WITH DIFFERENTIAL/PLATELET
Basophils Absolute: 0.1 10*3/uL (ref 0.0–0.1)
Basophils Relative: 1.3 % (ref 0.0–3.0)
Eosinophils Absolute: 0.2 10*3/uL (ref 0.0–0.7)
Eosinophils Relative: 3.3 % (ref 0.0–5.0)
HCT: 40.2 % (ref 36.0–46.0)
Hemoglobin: 13.6 g/dL (ref 12.0–15.0)
Lymphocytes Relative: 30.3 % (ref 12.0–46.0)
Lymphs Abs: 1.7 10*3/uL (ref 0.7–4.0)
MCHC: 33.8 g/dL (ref 30.0–36.0)
MCV: 87.9 fl (ref 78.0–100.0)
Monocytes Absolute: 0.5 10*3/uL (ref 0.1–1.0)
Monocytes Relative: 8 % (ref 3.0–12.0)
Neutro Abs: 3.2 10*3/uL (ref 1.4–7.7)
Neutrophils Relative %: 57.1 % (ref 43.0–77.0)
Platelets: 248 10*3/uL (ref 150.0–400.0)
RBC: 4.57 Mil/uL (ref 3.87–5.11)
RDW: 12.9 % (ref 11.5–15.5)
WBC: 5.7 10*3/uL (ref 4.0–10.5)

## 2021-05-18 LAB — HEMOGLOBIN A1C: Hgb A1c MFr Bld: 6 % (ref 4.6–6.5)

## 2021-05-18 LAB — COMPREHENSIVE METABOLIC PANEL
ALT: 19 U/L (ref 0–35)
AST: 19 U/L (ref 0–37)
Albumin: 4.6 g/dL (ref 3.5–5.2)
Alkaline Phosphatase: 77 U/L (ref 39–117)
BUN: 14 mg/dL (ref 6–23)
CO2: 28 mEq/L (ref 19–32)
Calcium: 9.6 mg/dL (ref 8.4–10.5)
Chloride: 102 mEq/L (ref 96–112)
Creatinine, Ser: 0.78 mg/dL (ref 0.40–1.20)
GFR: 78.66 mL/min (ref >60.00)
Glucose, Bld: 92 mg/dL (ref 70–99)
Potassium: 4.1 mEq/L (ref 3.5–5.1)
Sodium: 138 mEq/L (ref 135–145)
Total Bilirubin: 0.5 mg/dL (ref 0.2–1.2)
Total Protein: 7.2 g/dL (ref 6.0–8.3)

## 2021-05-18 LAB — LIPID PANEL
Cholesterol: 245 mg/dL — ABNORMAL HIGH (ref 0–200)
HDL: 60.4 mg/dL (ref >39.00)
LDL Cholesterol: 161 mg/dL — ABNORMAL HIGH (ref 0–99)
NonHDL: 185.07
Total CHOL/HDL Ratio: 4
Triglycerides: 120 mg/dL (ref 0.0–149.0)
VLDL: 24 mg/dL (ref 0.0–40.0)

## 2021-05-18 NOTE — Patient Instructions (Signed)
Preventive Care 34 Years and Older, Female ?Preventive care refers to lifestyle choices and visits with your health care provider that can promote health and wellness. Preventive care visits are also called wellness exams. ?What can I expect for my preventive care visit? ?Counseling ?Your health care provider may ask you questions about your: ?Medical history, including: ?Past medical problems. ?Family medical history. ?Pregnancy and menstrual history. ?History of falls. ?Current health, including: ?Memory and ability to understand (cognition). ?Emotional well-being. ?Home life and relationship well-being. ?Sexual activity and sexual health. ?Lifestyle, including: ?Alcohol, nicotine or tobacco, and drug use. ?Access to firearms. ?Diet, exercise, and sleep habits. ?Work and work Statistician. ?Sunscreen use. ?Safety issues such as seatbelt and bike helmet use. ?Physical exam ?Your health care provider will check your: ?Height and weight. These may be used to calculate your BMI (body mass index). BMI is a measurement that tells if you are at a healthy weight. ?Waist circumference. This measures the distance around your waistline. This measurement also tells if you are at a healthy weight and may help predict your risk of certain diseases, such as type 2 diabetes and high blood pressure. ?Heart rate and blood pressure. ?Body temperature. ?Skin for abnormal spots. ?What immunizations do I need? ?Vaccines are usually given at various ages, according to a schedule. Your health care provider will recommend vaccines for you based on your age, medical history, and lifestyle or other factors, such as travel or where you work. ?What tests do I need? ?Screening ?Your health care provider may recommend screening tests for certain conditions. This may include: ?Lipid and cholesterol levels. ?Hepatitis C test. ?Hepatitis B test. ?HIV (human immunodeficiency virus) test. ?STI (sexually transmitted infection) testing, if you are at  risk. ?Lung cancer screening. ?Colorectal cancer screening. ?Diabetes screening. This is done by checking your blood sugar (glucose) after you have not eaten for a while (fasting). ?Mammogram. Talk with your health care provider about how often you should have regular mammograms. ?BRCA-related cancer screening. This may be done if you have a family history of breast, ovarian, tubal, or peritoneal cancers. ?Bone density scan. This is done to screen for osteoporosis. ?Talk with your health care provider about your test results, treatment options, and if necessary, the need for more tests. ?Follow these instructions at home: ?Eating and drinking ? ?Eat a diet that includes fresh fruits and vegetables, whole grains, lean protein, and low-fat dairy products. Limit your intake of foods with high amounts of sugar, saturated fats, and salt. ?Take vitamin and mineral supplements as recommended by your health care provider. ?Do not drink alcohol if your health care provider tells you not to drink. ?If you drink alcohol: ?Limit how much you have to 0-1 drink a day. ?Know how much alcohol is in your drink. In the [place] one drink equals one 12 oz bottle of beer (355 mL), one 5 oz glass of wine (148 mL), or one 1? oz glass of hard liquor (44 mL). ?Lifestyle ?Brush your teeth every morning and night with fluoride toothpaste. Floss one time each day. ?Exercise for at least 30 minutes 5 or more days each week. ?Do not use any products that contain nicotine or tobacco. These products include cigarettes, chewing tobacco, and vaping devices, such as e-cigarettes. If you need help quitting, ask your health care provider. ?Do not use drugs. ?If you are sexually active, practice safe sex. Use a condom or other form of protection in order to prevent STIs. ?Take aspirin only as told by your  health care provider. Make sure that you understand how much to take and what form to take. Work with your health care provider to find out whether it  is safe and beneficial for you to take aspirin daily. ?Ask your health care provider if you need to take a cholesterol-lowering medicine (statin). ?Find healthy ways to manage stress, such as: ?Meditation, yoga, or listening to music. ?Journaling. ?Talking to a trusted person. ?Spending time with friends and family. ?Minimize exposure to UV radiation to reduce your risk of skin cancer. ?Safety ?Always wear your seat belt while driving or riding in a vehicle. ?Do not drive: ?If you have been drinking alcohol. Do not ride with someone who has been drinking. ?When you are tired or distracted. ?While texting. ?If you have been using any mind-altering substances or drugs. ?Wear a helmet and other protective equipment during sports activities. ?If you have firearms in your house, make sure you follow all gun safety procedures. ?What's next? ?Visit your health care provider once a year for an annual wellness visit. ?Ask your health care provider how often you should have your eyes and teeth checked. ?Stay up to date on all vaccines. ?This information is not intended to replace advice given to you by your health care provider. Make sure you discuss any questions you have with your health care provider. ?Document Revised: 07/22/2020 Document Reviewed: 07/22/2020 ?Elsevier Patient Education ? [place]. ? ?

## 2021-05-18 NOTE — Progress Notes (Signed)
Subjective:  ?  ? Olivia [name] is a 66 y.o. female and is here for a comprehensive physical exam. The patient reports no problems. ? ?Social History  ? ?Socioeconomic History  ? Marital status: Married  ?  Spouse name: Not on file  ? Number of children: 2  ? Years of education: Not on file  ? Highest education level: Not on file  ?Occupational History  ? Occupation: dietician  ?  Employer: [employer]  ?  Comment: retired   ?Tobacco Use  ? Smoking status: Never  ? Smokeless tobacco: Never  ?Vaping Use  ? Vaping Use: Never used  ?Substance and Sexual Activity  ? Alcohol use: Yes  ?  Alcohol/week: 2.0 standard drinks  ?  Types: 2 Standard drinks or equivalent per week  ?  Comment: Once a week  ? Drug use: No  ? Sexual activity: Yes  ?  Partners: Male  ?Other Topics Concern  ? Not on file  ?Social History Narrative  ? Exercise---  3-4 x a week  ? Right Handed  ? Lives in a two story home  ? Drinks no caffeine   ? ?Social Determinants of Health  ? ?Financial Resource Strain: Low Risk   ? Difficulty of Paying Living Expenses: Not hard at all  ?Food Insecurity: No Food Insecurity  ? Worried About Charity fundraiser in the Last Year: Never true  ? Ran Out of Food in the Last Year: Never true  ?Transportation Needs: No Transportation Needs  ? Lack of Transportation (Medical): No  ? Lack of Transportation (Non-Medical): No  ?Physical Activity: Sufficiently Active  ? Days of Exercise per Week: 7 days  ? Minutes of Exercise per Session: 60 min  ?Stress: No Stress Concern Present  ? Feeling of Stress : Not at all  ?Social Connections: Moderately Integrated  ? Frequency of Communication with Friends and Family: More than three times a week  ? Frequency of Social Gatherings with Friends and Family: More than three times a week  ? Attends Religious Services: Never  ? Active Member of Clubs or Organizations: Yes  ? Attends Archivist Meetings: 1 to 4 times per year  ? Marital Status: Married  ?Intimate Partner  Violence: Not At Risk  ? Fear of Current or Ex-Partner: No  ? Emotionally Abused: No  ? Physically Abused: No  ? Sexually Abused: No  ? ?Health Maintenance  ?Topic Date Due  ? TETANUS/TDAP  09/07/2020  ? MAMMOGRAM  05/04/2021  ? Pneumonia Vaccine 2+ Years old (2 - PCV) 05/01/2021  ? COVID-19 Vaccine (4 - Booster for [place] series) 06/03/2021 (Originally 02/23/2020)  ? INFLUENZA VACCINE  09/07/2021  ? COLONOSCOPY (Pts 45-8yr Insurance coverage will need to be confirmed)  04/04/2026  ? DEXA SCAN  Completed  ? Hepatitis C Screening  Completed  ? Zoster Vaccines- Shingrix  Completed  ? HPV VACCINES  Aged Out  ? URINE MICROALBUMIN  Discontinued  ? ? ?The following portions of the patient's history were reviewed and updated as appropriate: She  has a past medical history of Pre-diabetes. ?She does not have any pertinent problems on file. ?She  has a past surgical history that includes Cataract extraction (Right, 02/26/2021). ?Her family history includes Arthritis in her mother; Cancer in her father; Dementia in her mother; Diabetes in her father; Heart disease in her father; Hypertension in her father; Stroke in her father. ?She  reports that she has never smoked. She has never used  smokeless tobacco. She reports current alcohol use of about 2.0 standard drinks per week. She reports that she does not use drugs. ?She has a current medication list which includes the following prescription(s): aspirin-acetaminophen-caffeine, blood glucose meter kit and supplies, cholecalciferol, multiple vitamins-minerals, and naproxen sodium. ?Current Outpatient Medications on File Prior to Visit  ?Medication Sig Dispense Refill  ? aspirin-acetaminophen-caffeine (EXCEDRIN MIGRAINE) [phone] MG per tablet Take 1 tablet by mouth every 6 (six) hours as needed.    ? blood glucose meter kit and supplies Dispense based on patient and insurance preference. Use up to four times daily(FOR ICD-10 E10.9, E11.9). 1 each 0  ? Cholecalciferol  (VITAMIN D HIGH POTENCY PO) Take by mouth. Per pt taking 10,000 units daily    ? Multiple Vitamins-Minerals (IMMUNE SUPPORT PO) Take 2 capsules by mouth daily.    ? naproxen sodium (ALEVE) 220 MG tablet Take 220 mg by mouth.    ? ?No current facility-administered medications on file prior to visit.  ? ?She has No Known Allergies.. ? ?Review of Systems ?Review of Systems  ?Constitutional: Negative for activity change, appetite change and fatigue.  ?HENT: Negative for hearing loss, congestion, tinnitus and ear discharge.  dentist q20m?Eyes: Negative for visual disturbance (see optho )--- cataract on R done--- waiting on Left  ?Respiratory: Negative for cough, chest tightness and shortness of breath.   ?Cardiovascular: Negative for chest pain, palpitations and leg swelling.  ?Gastrointestinal: Negative for abdominal pain, diarrhea, constipation and abdominal distention.  ?Genitourinary: Negative for urgency, frequency, decreased urine volume and difficulty urinating.  ?Musculoskeletal: Negative for back pain, arthralgias and gait problem.  ?Skin: Negative for color change, pallor and rash.  ?Neurological: Negative for dizziness, light-headedness, numbness and headaches.  ?Hematological: Negative for adenopathy. Does not bruise/bleed easily.  ?Psychiatric/Behavioral: Negative for suicidal ideas, confusion, sleep disturbance, self-injury, dysphoric mood, decreased concentration and agitation.  ? ? ? ? ?Objective:  ? ? BP 120/74 (BP Location: Left Arm, Patient Position: Sitting, Cuff Size: Normal)   Pulse 71   Temp 98.7 ?F (37.1 ?C) (Oral)   Resp 16   Ht _0  (1.626 m)   Wt 163 lb 9.6 oz (74.2 kg)   SpO2 98%   BMI 28.08 kg/m?  ?General appearance: alert, cooperative, appears stated age, no distress, and mild distress ?Head: Normocephalic, without obvious abnormality, atraumatic ?Eyes: conjunctivae/corneas clear. PERRL, EOM's intact. Fundi benign. ?Ears: normal TM's and external ear canals both ears ?Nose: Nares  normal. Septum midline. Mucosa normal. No drainage or sinus tenderness. ?Throat: lips, mucosa, and tongue normal; teeth and gums normal ?Neck: no adenopathy, no carotid bruit, no JVD, supple, symmetrical, trachea midline, and thyroid not enlarged, symmetric, no tenderness/mass/nodules ?Back: symmetric, no curvature. ROM normal. No CVA tenderness. ?Lungs: clear to auscultation bilaterally ?Heart: regular rate and rhythm, S1, S2 normal, no murmur, click, rub or gallop ?Abdomen: soft, non-tender; bowel sounds normal; no masses,  no organomegaly ?Extremities: extremities normal, atraumatic, no cyanosis or edema ?Pulses: 2+ and symmetric ?Skin: Skin color, texture, turgor normal. No rashes or lesions ?Lymph nodes: Cervical, supraclavicular, and axillary nodes normal. ?Neurologic: Alert and oriented X 3, normal strength and tone. Normal symmetric reflexes. Normal coordination and gait  ?  ?Assessment:  ? ? Healthy female exam.    ?  ?Plan:  ? ? Ghm utd ?Check labs  ?See After Visit Summary for Counseling Recommendations  ? ?1. Hyperglycemia ?Con't diet and exercise  ?- Comprehensive metabolic panel ?- Hemoglobin A1c ? ?2. Preventative health care ?See above  ?-  CBC with Differential/Platelet ?- Comprehensive metabolic panel ?- Hemoglobin A1c ?- Lipid panel ? ?3. Hyperlipidemia, unspecified hyperlipidemia type ?Encourage heart healthy diet such as MIND or DASH diet, increase exercise, avoid trans fats, simple carbohydrates and processed foods, consider a krill or fish or flaxseed oil cap daily.   ?- Comprehensive metabolic panel ?- Lipid panel ? ?4. Estrogen deficiency ? ? ?- DG Bone Density; Future ? ? ?

## 2021-05-24 ENCOUNTER — Ambulatory Visit (HOSPITAL_BASED_OUTPATIENT_CLINIC_OR_DEPARTMENT_OTHER)
Admission: RE | Admit: 2021-05-24 | Discharge: 2021-05-24 | Disposition: A | Discharge status: Home / Self Care | Payer: PPO | Source: Ambulatory Visit | Attending: Family Medicine | Admitting: Family Medicine

## 2021-05-24 ENCOUNTER — Encounter (HOSPITAL_BASED_OUTPATIENT_CLINIC_OR_DEPARTMENT_OTHER): Payer: Self-pay

## 2021-05-24 DIAGNOSIS — E2839 Other primary ovarian failure: Secondary | ICD-10-CM

## 2021-05-24 DIAGNOSIS — Z1231 Encounter for screening mammogram for malignant neoplasm of breast: Secondary | ICD-10-CM | POA: Diagnosis not present

## 2021-05-24 DIAGNOSIS — M8589 Other specified disorders of bone density and structure, multiple sites: Secondary | ICD-10-CM | POA: Diagnosis not present

## 2021-05-24 DIAGNOSIS — Z780 Asymptomatic menopausal state: Secondary | ICD-10-CM | POA: Diagnosis not present

## 2021-05-31 DIAGNOSIS — H2512 Age-related nuclear cataract, left eye: Secondary | ICD-10-CM | POA: Diagnosis not present

## 2021-06-24 ENCOUNTER — Encounter: Payer: Self-pay | Admitting: Family Medicine

## 2021-08-17 ENCOUNTER — Ambulatory Visit (INDEPENDENT_AMBULATORY_CARE_PROVIDER_SITE_OTHER): Payer: PPO

## 2021-08-17 DIAGNOSIS — Z0000 Encounter for general adult medical examination without abnormal findings: Secondary | ICD-10-CM | POA: Diagnosis not present

## 2021-08-17 NOTE — Patient Instructions (Signed)
[name] , Thank you for taking time to come for your Medicare Wellness Visit. I appreciate your ongoing commitment to your health goals. Please review the following plan we discussed and let me know if I can assist you in the future.   Screening recommendations/referrals: Colonoscopy: Done 04/04/16 repeat every 10 years  Mammogram: done 05/24/21 repeat every year  Bone Density: done 05/24/21 repeat every 2 years Recommended yearly ophthalmology/optometry visit for glaucoma screening and checkup Recommended yearly dental visit for hygiene and checkup  Vaccinations: Influenza vaccine: done 11/10/20 Pneumococcal vaccine: due  Tdap vaccine: done 06/25/21 repeat every 10 years  Shingles vaccine: completed 10/31/17, 03/02/18   Covid-19:completed 1/9, 1/28, 12/29/19  Advanced directives: Please bring a copy of your health care power of attorney and living will to the office at your convenience.  Conditions/risks identified: maintain health  Next appointment: Follow up in one year for your annual wellness visit    Preventive Care 65 Years and Older, Female Preventive care refers to lifestyle choices and visits with your health care provider that can promote health and wellness. What does preventive care include? A yearly physical exam. This is also called an annual well check. Dental exams once or twice a year. Routine eye exams. Ask your health care provider how often you should have your eyes checked. Personal lifestyle choices, including: Daily care of your teeth and gums. Regular physical activity. Eating a healthy diet. Avoiding tobacco and drug use. Limiting alcohol use. Practicing safe sex. Taking low-dose aspirin every day. Taking vitamin and mineral supplements as recommended by your health care provider. What happens during an annual well check? The services and screenings done by your health care provider during your annual well check will depend on your age, overall health,  lifestyle risk factors, and family history of disease. Counseling  Your health care provider may ask you questions about your: Alcohol use. Tobacco use. Drug use. Emotional well-being. Home and relationship well-being. Sexual activity. Eating habits. History of falls. Memory and ability to understand (cognition). Work and work Statistician. Reproductive health. Screening  You may have the following tests or measurements: Height, weight, and BMI. Blood pressure. Lipid and cholesterol levels. These may be checked every 5 years, or more frequently if you are over 45 years old. Skin check. Lung cancer screening. You may have this screening every year starting at age 57 if you have a 30-pack-year history of smoking and currently smoke or have quit within the past 15 years. Fecal occult blood test (FOBT) of the stool. You may have this test every year starting at age 92. Flexible sigmoidoscopy or colonoscopy. You may have a sigmoidoscopy every 5 years or a colonoscopy every 10 years starting at age 21. Hepatitis C blood test. Hepatitis B blood test. Sexually transmitted disease (STD) testing. Diabetes screening. This is done by checking your blood sugar (glucose) after you have not eaten for a while (fasting). You may have this done every 1-3 years. Bone density scan. This is done to screen for osteoporosis. You may have this done starting at age 25. Mammogram. This may be done every 1-2 years. Talk to your health care provider about how often you should have regular mammograms. Talk with your health care provider about your test results, treatment options, and if necessary, the need for more tests. Vaccines  Your health care provider may recommend certain vaccines, such as: Influenza vaccine. This is recommended every year. Tetanus, diphtheria, and acellular pertussis (Tdap, Td) vaccine. You may need a  Td booster every 10 years. Zoster vaccine. You may need this after age  90. Pneumococcal 13-valent conjugate (PCV13) vaccine. One dose is recommended after age 16. Pneumococcal polysaccharide (PPSV23) vaccine. One dose is recommended after age 69. Talk to your health care provider about which screenings and vaccines you need and how often you need them. This information is not intended to replace advice given to you by your health care provider. Make sure you discuss any questions you have with your health care provider. Document Released: 02/20/2015 Document Revised: 10/14/2015 Document Reviewed: 11/25/2014 Elsevier Interactive Patient Education  2017 [employer] Prevention in the Home Falls can cause injuries. They can happen to people of all ages. There are many things you can do to make your home safe and to help prevent falls. What can I do on the outside of my home? Regularly fix the edges of walkways and driveways and fix any cracks. Remove anything that might make you trip as you walk through a door, such as a raised step or threshold. Trim any bushes or trees on the path to your home. Use bright outdoor lighting. Clear any walking paths of anything that might make someone trip, such as rocks or tools. Regularly check to see if handrails are loose or broken. Make sure that both sides of any steps have handrails. Any raised decks and porches should have guardrails on the edges. Have any leaves, snow, or ice cleared regularly. Use sand or salt on walking paths during winter. Clean up any spills in your garage right away. This includes oil or grease spills. What can I do in the bathroom? Use night lights. Install grab bars by the toilet and in the tub and shower. Do not use towel bars as grab bars. Use non-skid mats or decals in the tub or shower. If you need to sit down in the shower, use a plastic, non-slip stool. Keep the floor dry. Clean up any water that spills on the floor as soon as it happens. Remove soap buildup in the tub or shower  regularly. Attach bath mats securely with double-sided non-slip rug tape. Do not have throw rugs and other things on the floor that can make you trip. What can I do in the bedroom? Use night lights. Make sure that you have a light by your bed that is easy to reach. Do not use any sheets or blankets that are too big for your bed. They should not hang down onto the floor. Have a firm chair that has side arms. You can use this for support while you get dressed. Do not have throw rugs and other things on the floor that can make you trip. What can I do in the kitchen? Clean up any spills right away. Avoid walking on wet floors. Keep items that you use a lot in easy-to-reach places. If you need to reach something above you, use a strong step stool that has a grab bar. Keep electrical cords out of the way. Do not use floor polish or wax that makes floors slippery. If you must use wax, use non-skid floor wax. Do not have throw rugs and other things on the floor that can make you trip. What can I do with my stairs? Do not leave any items on the stairs. Make sure that there are handrails on both sides of the stairs and use them. Fix handrails that are broken or loose. Make sure that handrails are as long as the stairways. Check any  carpeting to make sure that it is firmly attached to the stairs. Fix any carpet that is loose or worn. Avoid having throw rugs at the top or bottom of the stairs. If you do have throw rugs, attach them to the floor with carpet tape. Make sure that you have a light switch at the top of the stairs and the bottom of the stairs. If you do not have them, ask someone to add them for you. What else can I do to help prevent falls? Wear shoes that: Do not have high heels. Have rubber bottoms. Are comfortable and fit you well. Are closed at the toe. Do not wear sandals. If you use a stepladder: Make sure that it is fully opened. Do not climb a closed stepladder. Make sure that  both sides of the stepladder are locked into place. Ask someone to hold it for you, if possible. Clearly mark and make sure that you can see: Any grab bars or handrails. First and last steps. Where the edge of each step is. Use tools that help you move around (mobility aids) if they are needed. These include: Canes. Walkers. Scooters. Crutches. Turn on the lights when you go into a dark area. Replace any light bulbs as soon as they burn out. Set up your furniture so you have a clear path. Avoid moving your furniture around. If any of your floors are uneven, fix them. If there are any pets around you, be aware of where they are. Review your medicines with your doctor. Some medicines can make you feel dizzy. This can increase your chance of falling. Ask your doctor what other things that you can do to help prevent falls. This information is not intended to replace advice given to you by your health care provider. Make sure you discuss any questions you have with your health care provider. Document Released: 11/20/2008 Document Revised: 07/02/2015 Document Reviewed: 02/28/2014 Elsevier Interactive Patient Education  2017 [employer].

## 2021-08-17 NOTE — Progress Notes (Signed)
Virtual Visit via Telephone Note  I connected with  Olivia [name] on 08/17/21 at  1:00 PM EDT by telephone and verified that I am speaking with the correct person using two identifiers.  Medicare Annual Wellness visit completed telephonically due to Covid-19 pandemic.   Persons participating in this call: This Health Coach and this patient.   Location: Patient: home Provider: office   I discussed the limitations, risks, security and privacy concerns of performing an evaluation and management service by telephone and the availability of in person appointments. The patient expressed understanding and agreed to proceed.  Unable to perform video visit due to video visit attempted and failed and/or patient does not have video capability.   Some vital signs may be absent or patient reported.   Willette Brace, LPN   Subjective:   Olivia [name] is a 66 y.o. female who presents for Medicare Annual (Subsequent) preventive examination.  Review of Systems     Cardiac Risk Factors include: advanced age (>2mn, >>75women);dyslipidemia     Objective:    There were no vitals filed for this visit. There is no height or weight on file to calculate BMI.     08/17/2021    1:12 PM 05/27/2020    8:24 AM 11/27/2019   11:18 AM 10/17/2019    1:35 PM 04/29/2019    9:31 AM  Advanced Directives  Does Patient Have a Medical Advance Directive? Yes No;Yes No Yes No  Type of [place]     Copy of [place] Chart? No - copy requested No - copy requested     Would patient like information on creating a medical advance directive?     No - Patient declined    Current Medications (verified) Outpatient Encounter Medications as of 08/17/2021  Medication Sig   aspirin-acetaminophen-caffeine (EXCEDRIN MIGRAINE) 250-250-65 MG per tablet Take 1 tablet by mouth every 6 (six) hours as needed.   blood  glucose meter kit and supplies Dispense based on patient and insurance preference. Use up to four times daily(FOR ICD-10 E10.9, E11.9).   Cholecalciferol (VITAMIN D HIGH POTENCY PO) Take by mouth. Per pt taking 10,000 units daily   Multiple Vitamins-Minerals (IMMUNE SUPPORT PO) Take 2 capsules by mouth daily.   naproxen sodium (ALEVE) 220 MG tablet Take 220 mg by mouth.   No facility-administered encounter medications on file as of 08/17/2021.    Allergies (verified) Patient has no known allergies.   History: Past Medical History:  Diagnosis Date   Pre-diabetes    Past Surgical History:  Procedure Laterality Date   CATARACT EXTRACTION Right 02/26/2021   Dr BTalbert Forest  Family History  Problem Relation Age of Onset   Arthritis Mother    Dementia Mother    Heart disease Father    Stroke Father    Hypertension Father    Diabetes Father    Cancer Father        liver   Social History   Socioeconomic History   Marital status: Married    Spouse name: Not on file   Number of children: 2   Years of education: Not on file   Highest education level: Not on file  Occupational History   Occupation: dietician    Employer: [employer]    Comment: retired   Tobacco Use   Smoking status: Never   Smokeless tobacco: Never  Vaping Use   Vaping Use: Never  used  Substance and Sexual Activity   Alcohol use: Yes    Alcohol/week: 2.0 standard drinks of alcohol    Types: 2 Standard drinks or equivalent per week    Comment: Once a week   Drug use: No   Sexual activity: Yes    Partners: Male  Other Topics Concern   Not on file  Social History Narrative   Exercise---  3-4 x a week   Right Handed   Lives in a two story home   Drinks no caffeine    Social Determinants of Health   Financial Resource Strain: Low Risk  (08/17/2021)   Overall Financial Resource Strain (CARDIA)    Difficulty of Paying Living Expenses: Not hard at all  Food Insecurity: No Food Insecurity (08/17/2021)    Hunger Vital Sign    Worried About Running Out of Food in the Last Year: Never true    Ran Out of Food in the Last Year: Never true  Transportation Needs: No Transportation Needs (08/17/2021)   PRAPARE - Hydrologist (Medical): No    Lack of Transportation (Non-Medical): No  Physical Activity: Sufficiently Active (08/17/2021)   Exercise Vital Sign    Days of Exercise per Week: 4 days    Minutes of Exercise per Session: 60 min  Stress: No Stress Concern Present (08/17/2021)   [place]    Feeling of Stress : Not at all  Social Connections: Moderately Integrated (08/17/2021)   Social Connection and Isolation Panel [NHANES]    Frequency of Communication with Friends and Family: Three times a week    Frequency of Social Gatherings with Friends and Family: Three times a week    Attends Religious Services: Never    Active Member of Clubs or Organizations: Yes    Attends Archivist Meetings: 1 to 4 times per year    Marital Status: Married    Tobacco Counseling Counseling given: Not Answered   Clinical Intake:  Pre-visit preparation completed: Yes  Pain : No/denies pain     Nutritional Risks: None Diabetes: No  How often do you need to have someone help you when you read instructions, pamphlets, or other written materials from your doctor or pharmacy?: 1 - Never  Diabetic?no  Interpreter Needed?: No  Information entered by :: Charlott Rakes, LPN   Activities of Daily Living    08/17/2021    1:15 PM  In your present state of health, do you have any difficulty performing the following activities:  Hearing? 1  Comment slight loss  Vision? 0  Difficulty concentrating or making decisions? 0  Walking or climbing stairs? 0  Dressing or bathing? 0  Doing errands, shopping? 0  Preparing Food and eating ? N  Using the Toilet? N  In the past six months, have you accidently  leaked urine? N  Do you have problems with loss of bowel control? N  Managing your Medications? N  Managing your Finances? N  Housekeeping or managing your Housekeeping? N    Patient Care Team: Carollee Herter, Alferd Apa, DO as PCP - General (Family Medicine) Berniece Salines, DO as PCP - Cardiology (Cardiology) de Zenia Resides, [place], OD (Optometry) Lake Bells., MD as Referring Physician (Gastroenterology) Pieter Partridge, DO as Consulting Physician (Neurology)  Indicate any recent Medical Services you may have received from other than [facility] providers in the past year (date may be approximate).     Assessment:  This is a routine wellness examination for [place].  Hearing/Vision screen Hearing Screening - Comments:: Stated mild deficiency  Vision Screening - Comments:: Pt follows up with Dr Susa Day for annul eye exams   Dietary issues and exercise activities discussed: Current Exercise Habits: Home exercise routine, Type of exercise: Other - see comments (kayaking), Time (Minutes): > 60, Frequency (Times/Week): 4, Weekly Exercise (Minutes/Week): 0   Goals Addressed             This Visit's Progress    Patient Stated       Maintain health       Depression Screen    08/17/2021    1:11 PM 05/18/2021    9:08 AM 05/27/2020    8:29 AM 05/01/2020    1:09 PM 04/29/2019    9:08 AM 04/21/2017    2:34 PM 02/15/2016    4:22 PM  PHQ 2/9 Scores  PHQ - 2 Score 0 0 0 0 0 0 0    Fall Risk    08/17/2021    1:14 PM 05/18/2021    9:08 AM 05/27/2020    8:28 AM 11/27/2019   11:17 AM 04/29/2019    9:27 AM  Fall Risk   Falls in the past year? 0 0 0 1 0  Number falls in past yr: 0 0 0 0 0  Injury with Fall? 0 0 0 0 0  Follow up Falls prevention discussed Falls evaluation completed Falls prevention discussed  Falls evaluation completed    FALL RISK PREVENTION PERTAINING TO THE HOME:  Any stairs in or around the home? Yes  If so, are there any without handrails? No  Home free of loose throw  rugs in walkways, pet beds, electrical cords, etc? Yes  Adequate lighting in your home to reduce risk of falls? Yes   ASSISTIVE DEVICES UTILIZED TO PREVENT FALLS:  Life alert? No  Use of a cane, walker or w/c? No  Grab bars in the bathroom? No  Shower chair or bench in shower? Yes  Elevated toilet seat or a handicapped toilet? No   TIMED UP AND GO:  Was the test performed? No .  Cognitive Function:    04/29/2019    9:29 AM  MMSE - Mini Mental State Exam  Orientation to time 5  Orientation to Place 5  Registration 3  Attention/ Calculation 5  Recall 3  Language- name 2 objects 2  Language- repeat 1  Language- follow 3 step command 3  Language- read & follow direction 1  Write a sentence 1  Copy design 1  Total score 30        08/17/2021    1:16 PM  6CIT Screen  What Year? 0 points  What month? 0 points  What time? 0 points  Count back from 20 0 points  Months in reverse 0 points  Repeat phrase 0 points  Total Score 0 points    Immunizations Immunization History  Administered Date(s) Administered   Fluad Quad(high Dose 65+) 11/10/2020   Influenza, Seasonal, Injecte, Preservative Fre 11/19/2011   Influenza,inj,Quad PF,6+ Mos 11/02/2012, 11/10/2014   Influenza-Unspecified 11/27/2015, 11/04/2016, 10/09/2018   PFIZER(Purple Top)SARS-COV-2 Vaccination 02/16/2019, 03/07/2019, 12/29/2019   Pneumococcal Polysaccharide-23 05/01/2020   Tdap 09/08/2010, 06/25/2021   Zoster Recombinat (Shingrix) 10/31/2017, 03/02/2018    TDAP status: Up to date  Flu Vaccine status: Up to date  Pneumococcal vaccine status: Due, Education has been provided regarding the importance of this vaccine. Advised may receive this vaccine at local  pharmacy or Health Dept. Aware to provide a copy of the vaccination record if obtained from local pharmacy or Health Dept. Verbalized acceptance and understanding.  Covid-19 vaccine status: Completed vaccines  Qualifies for Shingles Vaccine? Yes    Zostavax completed Yes   Shingrix Completed?: Yes  Screening Tests Health Maintenance  Topic Date Due   COVID-19 Vaccine (4 - Pfizer series) 02/23/2020   Pneumonia Vaccine 55+ Years old (2 - PCV) 05/01/2021   INFLUENZA VACCINE  09/07/2021   MAMMOGRAM  05/25/2022   COLONOSCOPY (Pts 45-85yr Insurance coverage will need to be confirmed)  04/04/2026   TETANUS/TDAP  06/26/2031   DEXA SCAN  Completed   Hepatitis C Screening  Completed   Zoster Vaccines- Shingrix  Completed   HPV VACCINES  Aged Out   URINE MICROALBUMIN  Discontinued    Health Maintenance  Health Maintenance Due  Topic Date Due   COVID-19 Vaccine (4 - Pfizer series) 02/23/2020   Pneumonia Vaccine 68 Years old (2 - PCV) 05/01/2021    Colorectal cancer screening: Type of screening: Colonoscopy. Completed 04/04/16. Repeat every 10 years  Mammogram status: Completed 05/24/21. Repeat every year  Bone Density status: Completed 05/24/21. Results reflect: Bone density results: OSTEOPENIA. Repeat every 2 years.   Additional Screening:  Hepatitis C Screening:  Completed 02/16/16  Vision Screening: Recommended annual ophthalmology exams for early detection of glaucoma and other disorders of the eye. Is the patient up to date with their annual eye exam?  Yes  Who is the provider or what is the name of the office in which the patient attends annual eye exams? Dr DIsac Caddy If pt is not established with a provider, would they like to be referred to a provider to establish care? No .   Dental Screening: Recommended annual dental exams for proper oral hygiene  Community Resource Referral / Chronic Care Management: CRR required this visit?  No   CCM required this visit?  No      Plan:     I have personally reviewed and noted the following in the patient's chart:   Medical and social history Use of alcohol, tobacco or illicit drugs  Current medications and supplements including opioid prescriptions.  Functional ability  and status Nutritional status Physical activity Advanced directives List of other physicians Hospitalizations, surgeries, and ER visits in previous 12 months Vitals Screenings to include cognitive, depression, and falls Referrals and appointments  In addition, I have reviewed and discussed with patient certain preventive protocols, quality metrics, and best practice recommendations. A written personalized care plan for preventive services as well as general preventive health recommendations were provided to patient.     TWillette Brace LPN   [id number]  Nurse Notes: None

## 2021-09-06 DIAGNOSIS — L814 Other melanin hyperpigmentation: Secondary | ICD-10-CM | POA: Diagnosis not present

## 2021-09-06 DIAGNOSIS — D225 Melanocytic nevi of trunk: Secondary | ICD-10-CM | POA: Diagnosis not present

## 2021-09-06 DIAGNOSIS — L821 Other seborrheic keratosis: Secondary | ICD-10-CM | POA: Diagnosis not present

## 2022-05-20 ENCOUNTER — Encounter: Payer: PPO | Admitting: Family Medicine

## 2022-06-10 IMAGING — MG MM DIGITAL SCREENING BILAT W/ TOMO AND CAD
8 series · 8 of 24 positions shown · non-contrast
Comparison: Previous exam(s).

CLINICAL DATA: Screening.

EXAM:
DIGITAL SCREENING BILATERAL MAMMOGRAM WITH TOMOSYNTHESIS AND CAD
TECHNIQUE: Bilateral screening digital craniocaudal and mediolateral oblique
mammograms were obtained. Bilateral screening digital breast
tomosynthesis was performed. The images were evaluated with
computer-aided detection.

[L CC synth-2D]
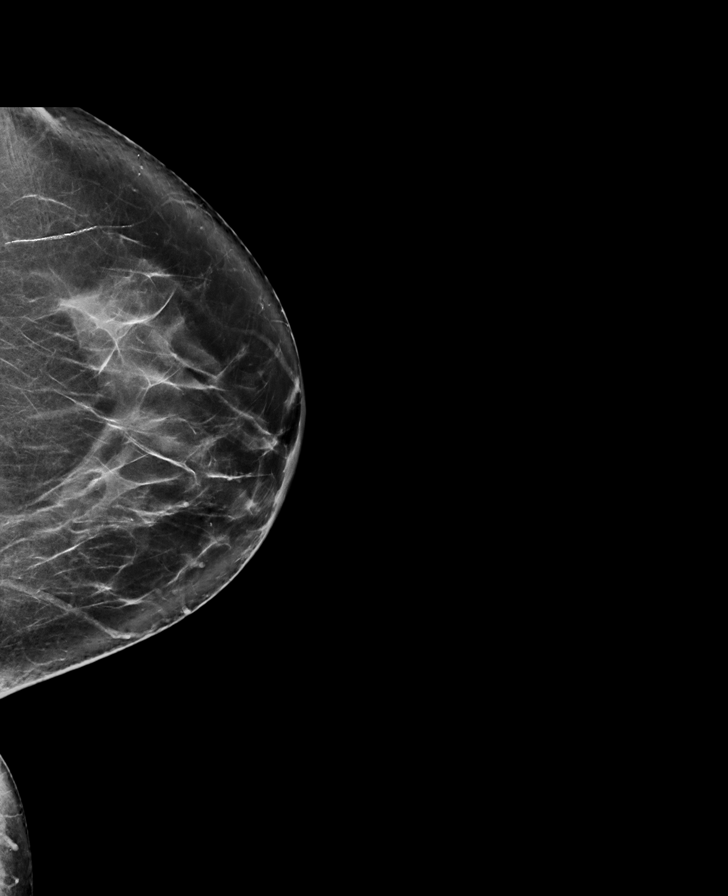

[L MLO synth-2D]
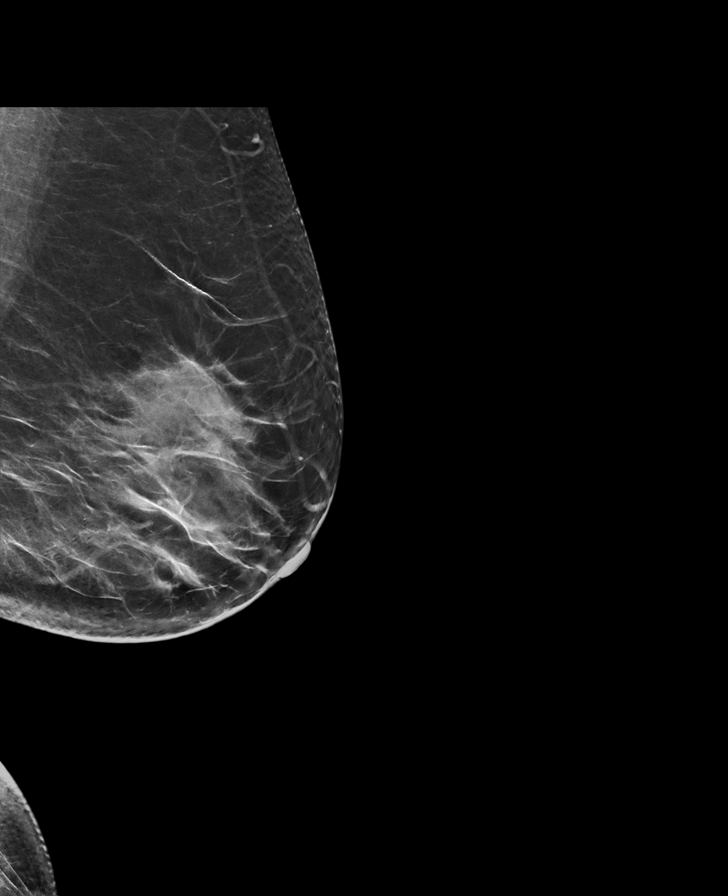

[R MLO synth-2D]
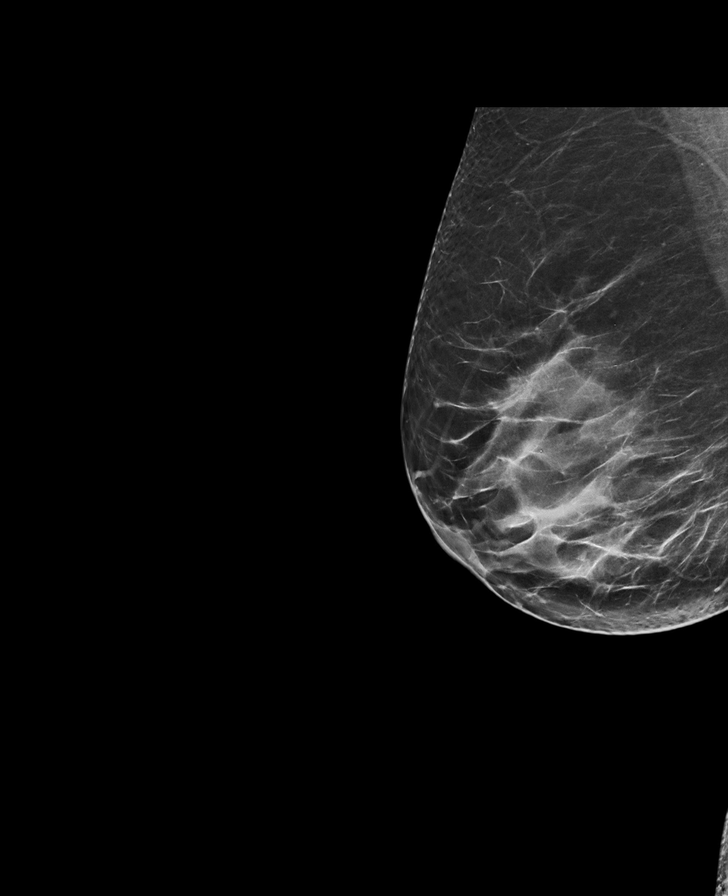

[R CC synth-2D]
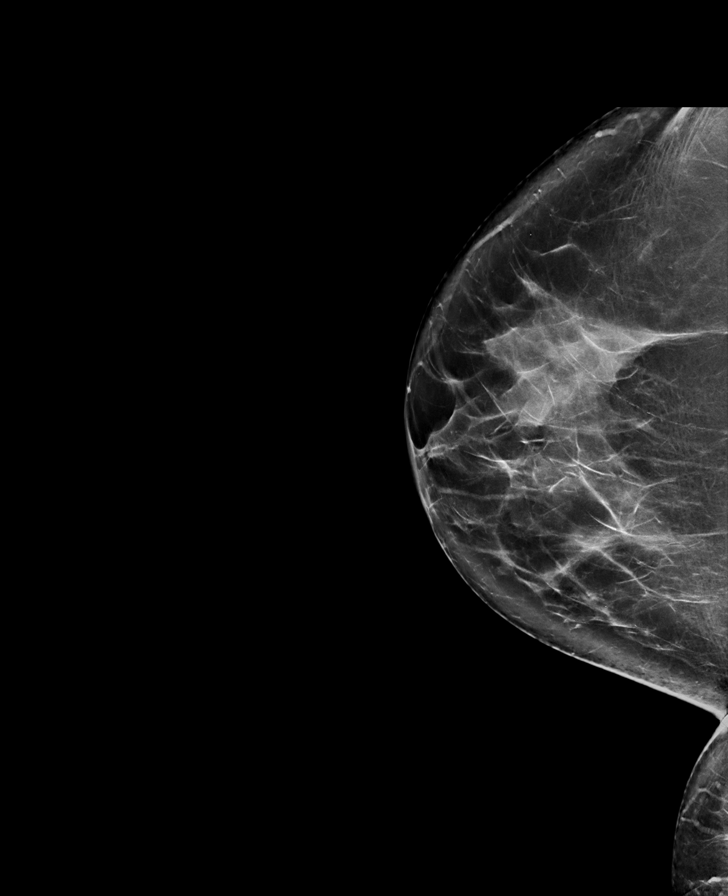

[L CC tomo · tomo slice 41/81.0]
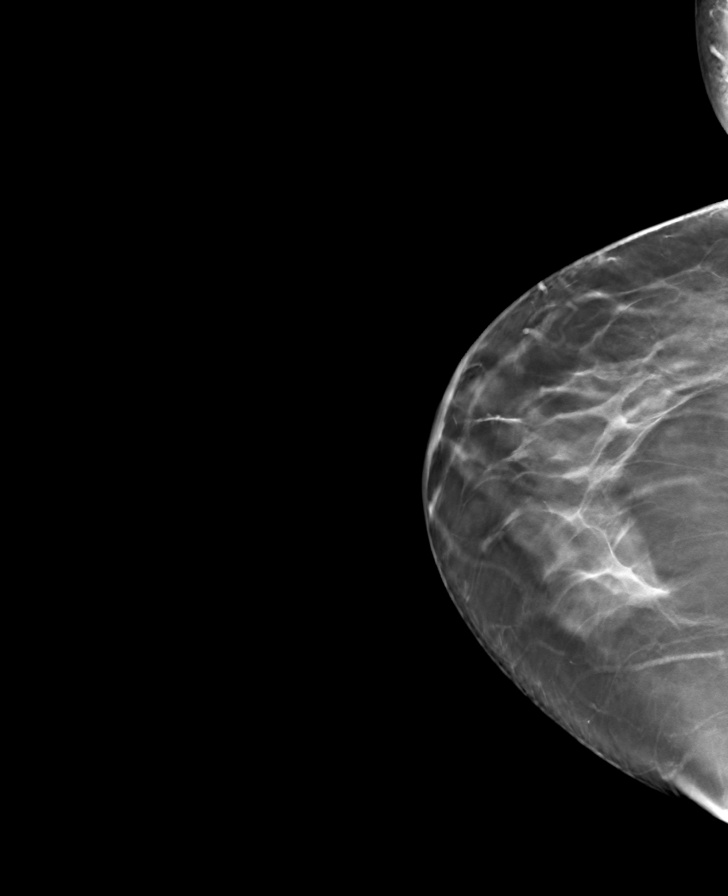

[R MLO tomo · tomo slice 40/79.0]
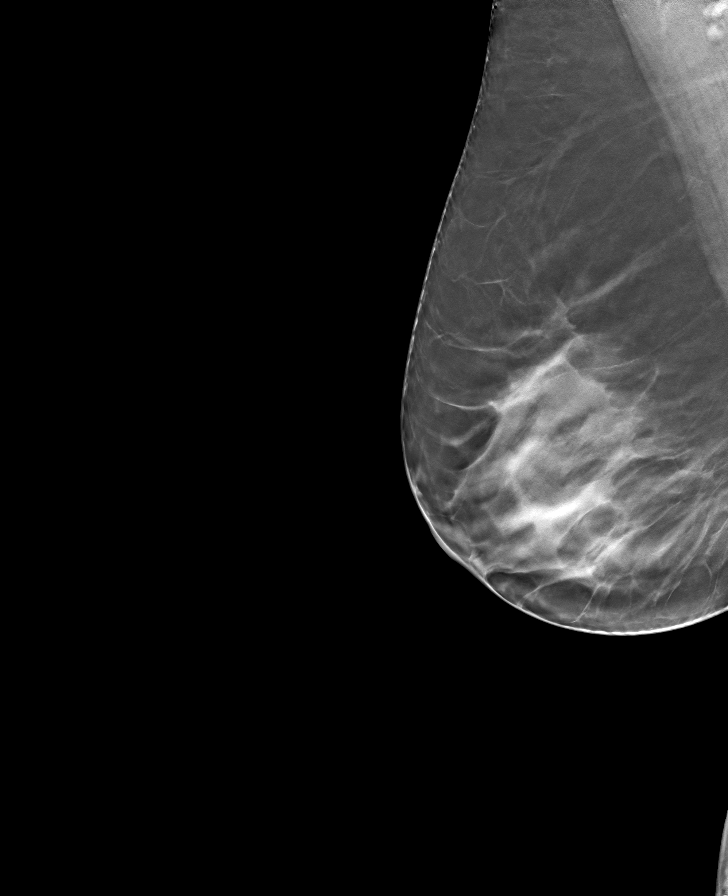

[L MLO tomo · tomo slice 39/78.0]
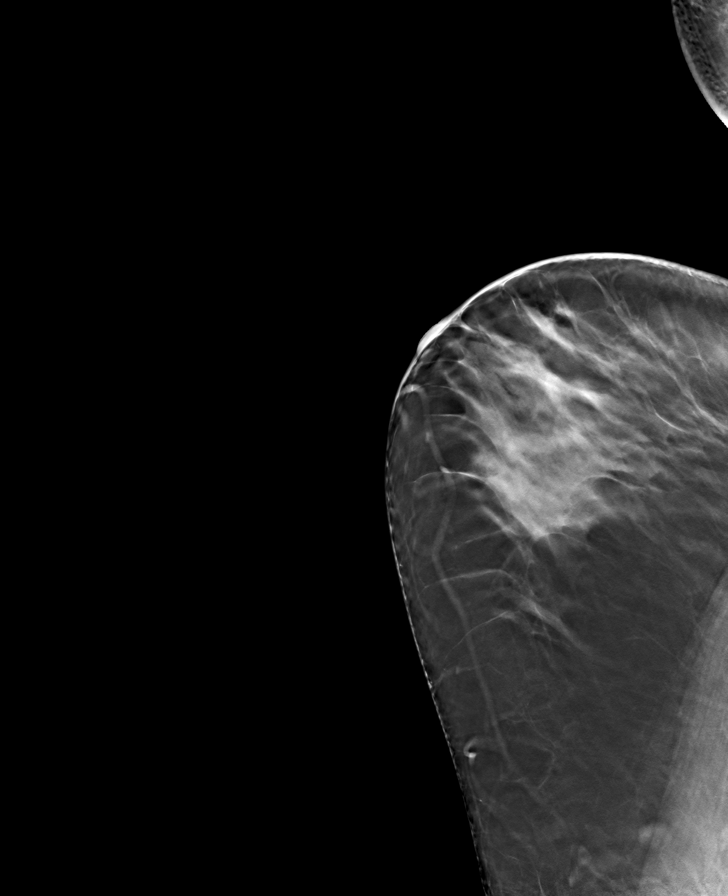

[R CC tomo · tomo slice 41/82.0]
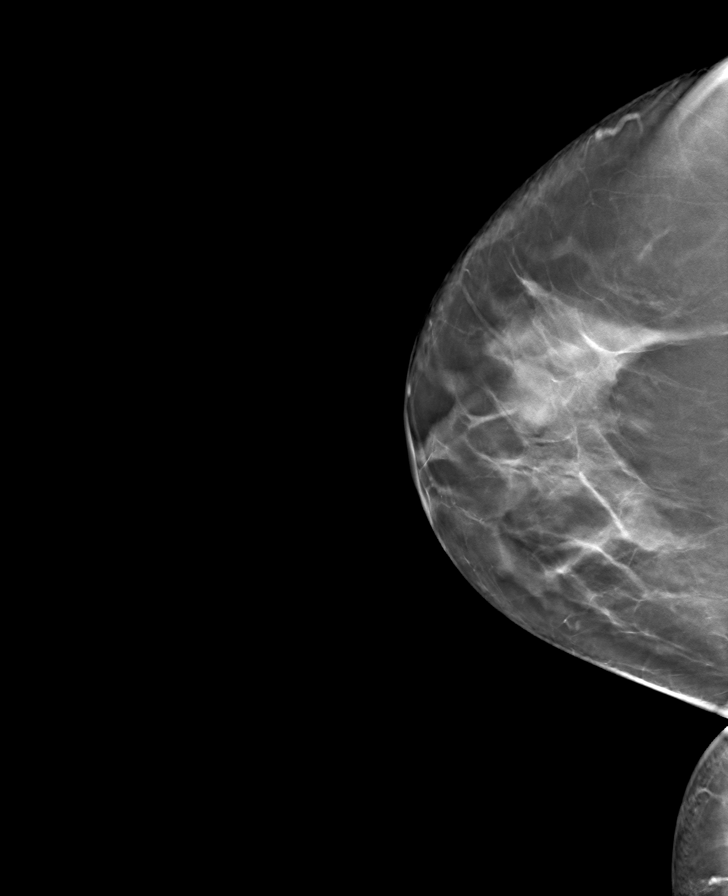

[8 of 24 positions shown; findings below may reference images not displayed]

ACR Breast Density Category c: The breast tissue is heterogeneously
dense, which may obscure small masses.
FINDINGS: There are no findings suspicious for malignancy.
IMPRESSION: No mammographic evidence of malignancy. A result letter of this
screening mammogram will be mailed directly to the patient.

RECOMMENDATION:
Screening mammogram in one year. (Code:[id number])

BI-RADS CATEGORY  1: Negative.

## 2022-06-13 ENCOUNTER — Ambulatory Visit (INDEPENDENT_AMBULATORY_CARE_PROVIDER_SITE_OTHER): Payer: PPO | Admitting: Family Medicine

## 2022-06-13 ENCOUNTER — Encounter: Payer: Self-pay | Admitting: Family Medicine

## 2022-06-13 ENCOUNTER — Other Ambulatory Visit (HOSPITAL_BASED_OUTPATIENT_CLINIC_OR_DEPARTMENT_OTHER): Payer: Self-pay | Admitting: Family Medicine

## 2022-06-13 VITALS — BP 118/80 | HR 73 | Temp 98.0°F | Resp 18 | Ht 64.0 in | Wt 159.6 lb

## 2022-06-13 DIAGNOSIS — Z0000 Encounter for general adult medical examination without abnormal findings: Secondary | ICD-10-CM

## 2022-06-13 DIAGNOSIS — R739 Hyperglycemia, unspecified: Secondary | ICD-10-CM | POA: Diagnosis not present

## 2022-06-13 DIAGNOSIS — E559 Vitamin D deficiency, unspecified: Secondary | ICD-10-CM | POA: Diagnosis not present

## 2022-06-13 DIAGNOSIS — R7303 Prediabetes: Secondary | ICD-10-CM

## 2022-06-13 DIAGNOSIS — Z1231 Encounter for screening mammogram for malignant neoplasm of breast: Secondary | ICD-10-CM

## 2022-06-13 DIAGNOSIS — E88819 Insulin resistance, unspecified: Secondary | ICD-10-CM | POA: Diagnosis not present

## 2022-06-13 DIAGNOSIS — Z23 Encounter for immunization: Secondary | ICD-10-CM | POA: Diagnosis not present

## 2022-06-13 DIAGNOSIS — E785 Hyperlipidemia, unspecified: Secondary | ICD-10-CM

## 2022-06-13 LAB — COMPREHENSIVE METABOLIC PANEL
ALT: 16 U/L (ref 0–35)
AST: 17 U/L (ref 0–37)
Albumin: 4.5 g/dL (ref 3.5–5.2)
Alkaline Phosphatase: 91 U/L (ref 39–117)
BUN: 11 mg/dL (ref 6–23)
CO2: 27 mEq/L (ref 19–32)
Calcium: 9.7 mg/dL (ref 8.4–10.5)
Chloride: 101 mEq/L (ref 96–112)
Creatinine, Ser: 0.77 mg/dL (ref 0.40–1.20)
GFR: 79.28 mL/min (ref >60.00)
Glucose, Bld: 86 mg/dL (ref 70–99)
Potassium: 4.2 mEq/L (ref 3.5–5.1)
Sodium: 140 mEq/L (ref 135–145)
Total Bilirubin: 0.5 mg/dL (ref 0.2–1.2)
Total Protein: 7.5 g/dL (ref 6.0–8.3)

## 2022-06-13 LAB — LIPID PANEL
Cholesterol: 227 mg/dL — ABNORMAL HIGH (ref 0–200)
HDL: 60.8 mg/dL (ref >39.00)
LDL Cholesterol: 135 mg/dL — ABNORMAL HIGH (ref 0–99)
NonHDL: 166.55
Total CHOL/HDL Ratio: 4
Triglycerides: 160 mg/dL — ABNORMAL HIGH (ref 0.0–149.0)
VLDL: 32 mg/dL (ref 0.0–40.0)

## 2022-06-13 LAB — CBC WITH DIFFERENTIAL/PLATELET
Basophils Absolute: 0.1 10*3/uL (ref 0.0–0.1)
Basophils Relative: 1 % (ref 0.0–3.0)
Eosinophils Absolute: 0.3 10*3/uL (ref 0.0–0.7)
Eosinophils Relative: 3.7 % (ref 0.0–5.0)
HCT: 40.4 % (ref 36.0–46.0)
Hemoglobin: 13.9 g/dL (ref 12.0–15.0)
Lymphocytes Relative: 29.6 % (ref 12.0–46.0)
Lymphs Abs: 2.2 10*3/uL (ref 0.7–4.0)
MCHC: 34.3 g/dL (ref 30.0–36.0)
MCV: 88.1 fl (ref 78.0–100.0)
Monocytes Absolute: 0.6 10*3/uL (ref 0.1–1.0)
Monocytes Relative: 7.8 % (ref 3.0–12.0)
Neutro Abs: 4.3 10*3/uL (ref 1.4–7.7)
Neutrophils Relative %: 57.9 % (ref 43.0–77.0)
Platelets: 277 10*3/uL (ref 150.0–400.0)
RBC: 4.59 Mil/uL (ref 3.87–5.11)
RDW: 12.9 % (ref 11.5–15.5)
WBC: 7.4 10*3/uL (ref 4.0–10.5)

## 2022-06-13 LAB — HEMOGLOBIN A1C: Hgb A1c MFr Bld: 6 % (ref 4.6–6.5)

## 2022-06-13 LAB — TSH: TSH: 3.21 u[IU]/mL (ref 0.35–5.50)

## 2022-06-13 LAB — VITAMIN D 25 HYDROXY (VIT D DEFICIENCY, FRACTURES): VITD: 25.98 ng/mL — ABNORMAL LOW (ref 30.00–100.00)

## 2022-06-13 NOTE — Assessment & Plan Note (Signed)
Ghm utd Check labs See AVS Health Maintenance  Topic Date Due   Pneumonia Vaccine 75+ Years old (2 of 2 - PCV) 05/01/2021   COVID-19 Vaccine (4 - 2023-24 season) 10/08/2021   MAMMOGRAM  05/25/2022   Medicare Annual Wellness (AWV)  08/18/2022   INFLUENZA VACCINE  09/08/2022   COLONOSCOPY (Pts 45-95yrs Insurance coverage will need to be confirmed)  04/04/2026   DTaP/Tdap/Td (3 - Td or Tdap) 06/26/2031   DEXA SCAN  Completed   Hepatitis C Screening  Completed   Zoster Vaccines- Shingrix  Completed   HPV VACCINES  Aged Out

## 2022-06-13 NOTE — Patient Instructions (Signed)
Preventive Care 65 Years and Older, Female Preventive care refers to lifestyle choices and visits with your health care provider that can promote health and wellness. Preventive care visits are also called wellness exams. What can I expect for my preventive care visit? Counseling Your health care provider may ask you questions about your: Medical history, including: Past medical problems. Family medical history. Pregnancy and menstrual history. History of falls. Current health, including: Memory and ability to understand (cognition). Emotional well-being. Home life and relationship well-being. Sexual activity and sexual health. Lifestyle, including: Alcohol, nicotine or tobacco, and drug use. Access to firearms. Diet, exercise, and sleep habits. Work and work environment. Sunscreen use. Safety issues such as seatbelt and bike helmet use. Physical exam Your health care provider will check your: Height and weight. These may be used to calculate your BMI (body mass index). BMI is a measurement that tells if you are at a healthy weight. Waist circumference. This measures the distance around your waistline. This measurement also tells if you are at a healthy weight and may help predict your risk of certain diseases, such as type 2 diabetes and high blood pressure. Heart rate and blood pressure. Body temperature. Skin for abnormal spots. What immunizations do I need?  Vaccines are usually given at various ages, according to a schedule. Your health care provider will recommend vaccines for you based on your age, medical history, and lifestyle or other factors, such as travel or where you work. What tests do I need? Screening Your health care provider may recommend screening tests for certain conditions. This may include: Lipid and cholesterol levels. Hepatitis C test. Hepatitis B test. HIV (human immunodeficiency virus) test. STI (sexually transmitted infection) testing, if you are at  risk. Lung cancer screening. Colorectal cancer screening. Diabetes screening. This is done by checking your blood sugar (glucose) after you have not eaten for a while (fasting). Mammogram. Talk with your health care provider about how often you should have regular mammograms. BRCA-related cancer screening. This may be done if you have a family history of breast, ovarian, tubal, or peritoneal cancers. Bone density scan. This is done to screen for osteoporosis. Talk with your health care provider about your test results, treatment options, and if necessary, the need for more tests. Follow these instructions at home: Eating and drinking  Eat a diet that includes fresh fruits and vegetables, whole grains, lean protein, and low-fat dairy products. Limit your intake of foods with high amounts of sugar, saturated fats, and salt. Take vitamin and mineral supplements as recommended by your health care provider. Do not drink alcohol if your health care provider tells you not to drink. If you drink alcohol: Limit how much you have to 0-1 drink a day. Know how much alcohol is in your drink. In the [place] one drink equals one 12 oz bottle of beer (355 mL), one 5 oz glass of wine (148 mL), or one 1 oz glass of hard liquor (44 mL). Lifestyle Brush your teeth every morning and night with fluoride toothpaste. Floss one time each day. Exercise for at least 30 minutes 5 or more days each week. Do not use any products that contain nicotine or tobacco. These products include cigarettes, chewing tobacco, and vaping devices, such as e-cigarettes. If you need help quitting, ask your health care provider. Do not use drugs. If you are sexually active, practice safe sex. Use a condom or other form of protection in order to prevent STIs. Take aspirin only as told by   your health care provider. Make sure that you understand how much to take and what form to take. Work with your health care provider to find out whether it  is safe and beneficial for you to take aspirin daily. Ask your health care provider if you need to take a cholesterol-lowering medicine (statin). Find healthy ways to manage stress, such as: Meditation, yoga, or listening to music. Journaling. Talking to a trusted person. Spending time with friends and family. Minimize exposure to UV radiation to reduce your risk of skin cancer. Safety Always wear your seat belt while driving or riding in a vehicle. Do not drive: If you have been drinking alcohol. Do not ride with someone who has been drinking. When you are tired or distracted. While texting. If you have been using any mind-altering substances or drugs. Wear a helmet and other protective equipment during sports activities. If you have firearms in your house, make sure you follow all gun safety procedures. What's next? Visit your health care provider once a year for an annual wellness visit. Ask your health care provider how often you should have your eyes and teeth checked. Stay up to date on all vaccines. This information is not intended to replace advice given to you by your health care provider. Make sure you discuss any questions you have with your health care provider. Document Revised: 07/22/2020 Document Reviewed: 07/22/2020 Elsevier Patient Education  2023 [employer]   

## 2022-06-13 NOTE — Progress Notes (Signed)
Established Patient Office Visit  Subjective   Patient ID: Olivia [name], female    DOB: 11/01/1955  Age: 67 y.o. MRN: [id number]  Chief Complaint  Patient presents with   Annual Exam    Pt states fasting     HPI Pt is here for cpe.  No complaints.  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 10/02/2018   Acromioclavicular arthrosis 04/23/2018   Loss of transverse plantar arch 04/23/2018   Non-recurrent acute serous otitis media of left ear 04/23/2018   Right wrist injury, subsequent encounter 05/14/2017   Preventative health care 04/21/2017   Migraine without aura and without status migrainosus, not intractable 10/17/2016   Past Medical History:  Diagnosis Date   Pre-diabetes    Past Surgical History:  Procedure Laterality Date   CATARACT EXTRACTION Right 02/26/2021   Dr Vonna Kotyk   CATARACT EXTRACTION Left 04/21/2021   bevis   Social History   Tobacco Use   Smoking status: Never   Smokeless tobacco: Never  Vaping Use   Vaping Use: Never used  Substance Use Topics   Alcohol use: Yes    Alcohol/week: 2.0 standard drinks of alcohol    Types: 2 Standard drinks or equivalent per week    Comment: Once a week   Drug use: No   Social History   Socioeconomic History   Marital status: Married    Spouse name: Not on file   Number of children: 2   Years of education: Not on file   Highest education level: Not on file  Occupational History   Occupation: dietician    Employer: [employer]    Comment: retired   Tobacco Use   Smoking status: Never   Smokeless tobacco: Never  Vaping Use   Vaping Use: Never used  Substance and Sexual Activity   Alcohol use: Yes    Alcohol/week: 2.0 standard drinks of alcohol     Types: 2 Standard drinks or equivalent per week    Comment: Once a week   Drug use: No   Sexual activity: Yes    Partners: Male  Other Topics Concern   Not on file  Social History Narrative   Exercise---  3-4 x a week   Right Handed   Lives in a two story home   Drinks no caffeine    Social Determinants of Health   Financial Resource Strain: Low Risk  (08/17/2021)   Overall Financial Resource Strain (CARDIA)    Difficulty of Paying Living Expenses: Not hard at all  Food Insecurity: No Food Insecurity (08/17/2021)   Hunger Vital Sign    Worried About Running Out of Food in the Last Year: Never true    Ran Out of Food in the Last Year: Never true  Transportation Needs: No Transportation Needs (08/17/2021)   PRAPARE - Administrator, Civil Service (Medical): No    Lack of Transportation (Non-Medical): No  Physical Activity: Sufficiently Active (08/17/2021)   Exercise Vital Sign    Days of Exercise per Week: 4 days    Minutes of Exercise per Session: 60 min  Stress: No Stress Concern Present (08/17/2021)   [employer] of Occupational Health - Occupational Stress Questionnaire    Feeling of Stress : Not at  all  Social Connections: Moderately Integrated (08/17/2021)   Social Connection and Isolation Panel [NHANES]    Frequency of Communication with Friends and Family: Three times a week    Frequency of Social Gatherings with Friends and Family: Three times a week    Attends Religious Services: Never    Active Member of Clubs or Organizations: Yes    Attends Banker Meetings: 1 to 4 times per year    Marital Status: Married  Catering manager Violence: Not At Risk (08/17/2021)   Humiliation, Afraid, Rape, and Kick questionnaire    Fear of Current or Ex-Partner: No    Emotionally Abused: No    Physically Abused: No    Sexually Abused: No   Family Status  Relation Name Status   Mother  Deceased at age 75       renal failure   Father  Deceased at age 8        liver   Brother  Deceased at age 55        house fire   Family History  Problem Relation Age of Onset   Arthritis Mother    Dementia Mother    Heart disease Father    Stroke Father    Hypertension Father    Diabetes Father    Cancer Father        liver   No Known Allergies    Review of Systems  Constitutional:  Negative for chills, fever and malaise/fatigue.  HENT:  Negative for congestion and hearing loss.   Eyes:  Negative for blurred vision and discharge.  Respiratory:  Negative for cough, sputum production and shortness of breath.   Cardiovascular:  Negative for chest pain, palpitations and leg swelling.  Gastrointestinal:  Negative for abdominal pain, blood in stool, constipation, diarrhea, heartburn, nausea and vomiting.  Genitourinary:  Negative for dysuria, frequency, hematuria and urgency.  Musculoskeletal:  Negative for back pain, falls and myalgias.  Skin:  Negative for rash.  Neurological:  Negative for dizziness, sensory change, loss of consciousness, weakness and headaches.  Endo/Heme/Allergies:  Negative for environmental allergies. Does not bruise/bleed easily.  Psychiatric/Behavioral:  Negative for depression and suicidal ideas. The patient is not nervous/anxious and does not have insomnia.       Objective:     BP 118/80 (BP Location: Left Arm, Patient Position: Sitting, Cuff Size: Normal)   Pulse 73   Temp 98 F (36.7 C) (Oral)   Resp 18   Ht 5\' 4"  (1.626 m)   Wt 159 lb 9.6 oz (72.4 kg)   SpO2 98%   BMI 27.40 kg/m  BP Readings from Last 3 Encounters:  06/13/22 118/80  05/18/21 120/74  11/10/20 122/70   Wt Readings from Last 3 Encounters:  06/13/22 159 lb 9.6 oz (72.4 kg)  05/18/21 163 lb 9.6 oz (74.2 kg)  11/10/20 162 lb 3.2 oz (73.6 kg)   SpO2 Readings from Last 3 Encounters:  06/13/22 98%  05/18/21 98%  11/10/20 97%      Physical Exam Vitals and nursing note reviewed.  Constitutional:      General: She is not in acute  distress.    Appearance: She is well-developed.  HENT:     Head: Normocephalic and atraumatic.     Right Ear: External ear normal.     Left Ear: External ear normal.     Nose: Nose normal.  Eyes:     Conjunctiva/sclera: Conjunctivae normal.     Pupils: Pupils are equal, round, and  reactive to light.  Neck:     Thyroid: No thyromegaly.     Vascular: No carotid bruit or JVD.  Cardiovascular:     Rate and Rhythm: Normal rate and regular rhythm.     Heart sounds: Normal heart sounds. No murmur heard. Pulmonary:     Effort: Pulmonary effort is normal. No respiratory distress.     Breath sounds: Normal breath sounds. No wheezing or rales.  Chest:     Chest wall: No tenderness.  Abdominal:     General: Bowel sounds are normal.     Palpations: Abdomen is soft.     Tenderness: There is no abdominal tenderness. There is no rebound.  Musculoskeletal:        General: No tenderness. Normal range of motion.     Cervical back: Normal range of motion and neck supple.  Skin:    General: Skin is warm and dry.     Findings: No lesion or rash.  Neurological:     General: No focal deficit present.     Mental Status: She is alert and oriented to person, place, and time.  Psychiatric:        Behavior: Behavior normal.        Thought Content: Thought content normal.        Judgment: Judgment normal.      No results found for any visits on 06/13/22.  Last CBC Lab Results  Component Value Date   WBC 5.7 05/18/2021   HGB 13.6 05/18/2021   HCT 40.2 05/18/2021   MCV 87.9 05/18/2021   MCH 29.8 10/17/2019   RDW 12.9 05/18/2021   PLT 248.0 05/18/2021   Last metabolic panel Lab Results  Component Value Date   GLUCOSE 92 05/18/2021   NA 138 05/18/2021   K 4.1 05/18/2021   CL 102 05/18/2021   CO2 28 05/18/2021   BUN 14 05/18/2021   CREATININE 0.78 05/18/2021   GFRNONAA >60 10/17/2019   CALCIUM 9.6 05/18/2021   PROT 7.2 05/18/2021   ALBUMIN 4.6 05/18/2021   BILITOT 0.5 05/18/2021    ALKPHOS 77 05/18/2021   AST 19 05/18/2021   ALT 19 05/18/2021   ANIONGAP 10 10/17/2019   Last lipids Lab Results  Component Value Date   CHOL 245 (H) 05/18/2021   HDL 60.40 05/18/2021   LDLCALC 161 (H) 05/18/2021   LDLDIRECT 164.5 11/21/2012   TRIG 120.0 05/18/2021   CHOLHDL 4 05/18/2021   Last hemoglobin A1c Lab Results  Component Value Date   HGBA1C 6.0 05/18/2021   Last thyroid functions Lab Results  Component Value Date   TSH 2.98 04/23/2018   Last vitamin D Lab Results  Component Value Date   VD25OH 53.75 04/21/2020   Last vitamin B12 and Folate No results found for: "VITAMINB12", "FOLATE"    The 10-year ASCVD risk score (Arnett DK, et al., 2019) is: 13%    Assessment & Plan:   Problem List Items Addressed This Visit       Unprioritized   Preventative health care    Ghm utd Check labs See AVS Health Maintenance  Topic Date Due   Pneumonia Vaccine 66+ Years old (2 of 2 - PCV) 05/01/2021   COVID-19 Vaccine (4 - 2023-24 season) 10/08/2021   MAMMOGRAM  05/25/2022   Medicare Annual Wellness (AWV)  08/18/2022   INFLUENZA VACCINE  09/08/2022   COLONOSCOPY (Pts 45-21yrs Insurance coverage will need to be confirmed)  04/04/2026   DTaP/Tdap/Td (3 - Td or Tdap) 06/26/2031  DEXA SCAN  Completed   Hepatitis C Screening  Completed   Zoster Vaccines- Shingrix  Completed   HPV VACCINES  Aged Out        Pre-diabetes    Con't diet  Check labs       Relevant Orders   CBC with Differential/Platelet   Comprehensive metabolic panel   Lipid panel   TSH   Other Visit Diagnoses     Hyperglycemia    -  Primary   Relevant Orders   Comprehensive metabolic panel   Hemoglobin A1c   Hyperlipidemia, unspecified hyperlipidemia type       Relevant Orders   Comprehensive metabolic panel   Lipid panel   Insulin resistance       Relevant Orders   CBC with Differential/Platelet   Comprehensive metabolic panel   Lipid panel   TSH   Vitamin D deficiency        Relevant Orders   VITAMIN D 25 Hydroxy (Vit-D Deficiency, Fractures)   Need for pneumococcal 20-valent conjugate vaccination       Relevant Orders   Pneumococcal conjugate vaccine 20-valent (Prevnar 20) (Completed)       Return in about 1 year (around 06/13/2023), or if symptoms worsen or fail to improve, for annual exam, fasting.    Donato Schultz, DO

## 2022-06-13 NOTE — Assessment & Plan Note (Signed)
Cont diet Check labs 

## 2022-06-15 ENCOUNTER — Other Ambulatory Visit: Payer: Self-pay

## 2022-06-15 MED ORDER — VITAMIN D (ERGOCALCIFEROL) 1.25 MG (50000 UNIT) PO CAPS: 50000.0000 [IU] | ORAL_CAPSULE | ORAL | 1 refills | Status: DC

## 2022-06-20 ENCOUNTER — Ambulatory Visit (HOSPITAL_BASED_OUTPATIENT_CLINIC_OR_DEPARTMENT_OTHER)
Admission: RE | Admit: 2022-06-20 | Discharge: 2022-06-20 | Disposition: A | Discharge status: Home / Self Care | Payer: PPO | Source: Ambulatory Visit | Attending: Family Medicine | Admitting: Family Medicine

## 2022-06-20 ENCOUNTER — Encounter (HOSPITAL_BASED_OUTPATIENT_CLINIC_OR_DEPARTMENT_OTHER): Payer: Self-pay

## 2022-06-20 DIAGNOSIS — Z1231 Encounter for screening mammogram for malignant neoplasm of breast: Secondary | ICD-10-CM | POA: Insufficient documentation

## 2022-08-22 ENCOUNTER — Ambulatory Visit (INDEPENDENT_AMBULATORY_CARE_PROVIDER_SITE_OTHER): Payer: PPO | Admitting: *Deleted

## 2022-08-22 DIAGNOSIS — Z0000 Encounter for general adult medical examination without abnormal findings: Secondary | ICD-10-CM

## 2022-08-22 NOTE — Patient Instructions (Signed)
[name] , Thank you for taking time to come for your Medicare Wellness Visit. I appreciate your ongoing commitment to your health goals. Please review the following plan we discussed and let me know if I can assist you in the future.     This is a list of the screening recommended for you and due dates:  Health Maintenance  Topic Date Due   COVID-19 Vaccine (4 - 2023-24 season) 10/08/2021   Flu Shot  09/08/2022   Mammogram  06/20/2023   Medicare Annual Wellness Visit  08/22/2023   Colon Cancer Screening  04/04/2026   DTaP/Tdap/Td vaccine (3 - Td or Tdap) 06/26/2031   Pneumonia Vaccine  Completed   DEXA scan (bone density measurement)  Completed   Hepatitis C Screening  Completed   Zoster (Shingles) Vaccine  Completed   HPV Vaccine  Aged Out    Next appointment: Follow up in one year for your annual wellness visit.   Preventive Care 47 Years and Older, Female Preventive care refers to lifestyle choices and visits with your health care provider that can promote health and wellness. What does preventive care include? A yearly physical exam. This is also called an annual well check. Dental exams once or twice a year. Routine eye exams. Ask your health care provider how often you should have your eyes checked. Personal lifestyle choices, including: Daily care of your teeth and gums. Regular physical activity. Eating a healthy diet. Avoiding tobacco and drug use. Limiting alcohol use. Practicing safe sex. Taking low-dose aspirin every day. Taking vitamin and mineral supplements as recommended by your health care provider. What happens during an annual well check? The services and screenings done by your health care provider during your annual well check will depend on your age, overall health, lifestyle risk factors, and family history of disease. Counseling  Your health care provider may ask you questions about your: Alcohol use. Tobacco use. Drug use. Emotional  well-being. Home and relationship well-being. Sexual activity. Eating habits. History of falls. Memory and ability to understand (cognition). Work and work Astronomer. Reproductive health. Screening  You may have the following tests or measurements: Height, weight, and BMI. Blood pressure. Lipid and cholesterol levels. These may be checked every 5 years, or more frequently if you are over 55 years old. Skin check. Lung cancer screening. You may have this screening every year starting at age 61 if you have a 30-pack-year history of smoking and currently smoke or have quit within the past 15 years. Fecal occult blood test (FOBT) of the stool. You may have this test every year starting at age 62. Flexible sigmoidoscopy or colonoscopy. You may have a sigmoidoscopy every 5 years or a colonoscopy every 10 years starting at age 26. Hepatitis C blood test. Hepatitis B blood test. Sexually transmitted disease (STD) testing. Diabetes screening. This is done by checking your blood sugar (glucose) after you have not eaten for a while (fasting). You may have this done every 1-3 years. Bone density scan. This is done to screen for osteoporosis. You may have this done starting at age 29. Mammogram. This may be done every 1-2 years. Talk to your health care provider about how often you should have regular mammograms. Talk with your health care provider about your test results, treatment options, and if necessary, the need for more tests. Vaccines  Your health care provider may recommend certain vaccines, such as: Influenza vaccine. This is recommended every year. Tetanus, diphtheria, and acellular pertussis (Tdap, Td) vaccine.  You may need a Td booster every 10 years. Zoster vaccine. You may need this after age 24. Pneumococcal 13-valent conjugate (PCV13) vaccine. One dose is recommended after age 38. Pneumococcal polysaccharide (PPSV23) vaccine. One dose is recommended after age 21. Talk to your  health care provider about which screenings and vaccines you need and how often you need them. This information is not intended to replace advice given to you by your health care provider. Make sure you discuss any questions you have with your health care provider. Document Released: 02/20/2015 Document Revised: 10/14/2015 Document Reviewed: 11/25/2014 Elsevier Interactive Patient Education  2017 [employer].  Fall Prevention in the Home Falls can cause injuries. They can happen to people of all ages. There are many things you can do to make your home safe and to help prevent falls. What can I do on the outside of my home? Regularly fix the edges of walkways and driveways and fix any cracks. Remove anything that might make you trip as you walk through a door, such as a raised step or threshold. Trim any bushes or trees on the path to your home. Use bright outdoor lighting. Clear any walking paths of anything that might make someone trip, such as rocks or tools. Regularly check to see if handrails are loose or broken. Make sure that both sides of any steps have handrails. Any raised decks and porches should have guardrails on the edges. Have any leaves, snow, or ice cleared regularly. Use sand or salt on walking paths during winter. Clean up any spills in your garage right away. This includes oil or grease spills. What can I do in the bathroom? Use night lights. Install grab bars by the toilet and in the tub and shower. Do not use towel bars as grab bars. Use non-skid mats or decals in the tub or shower. If you need to sit down in the shower, use a plastic, non-slip stool. Keep the floor dry. Clean up any water that spills on the floor as soon as it happens. Remove soap buildup in the tub or shower regularly. Attach bath mats securely with double-sided non-slip rug tape. Do not have throw rugs and other things on the floor that can make you trip. What can I do in the bedroom? Use night  lights. Make sure that you have a light by your bed that is easy to reach. Do not use any sheets or blankets that are too big for your bed. They should not hang down onto the floor. Have a firm chair that has side arms. You can use this for support while you get dressed. Do not have throw rugs and other things on the floor that can make you trip. What can I do in the kitchen? Clean up any spills right away. Avoid walking on wet floors. Keep items that you use a lot in easy-to-reach places. If you need to reach something above you, use a strong step stool that has a grab bar. Keep electrical cords out of the way. Do not use floor polish or wax that makes floors slippery. If you must use wax, use non-skid floor wax. Do not have throw rugs and other things on the floor that can make you trip. What can I do with my stairs? Do not leave any items on the stairs. Make sure that there are handrails on both sides of the stairs and use them. Fix handrails that are broken or loose. Make sure that handrails are as long as  the stairways. Check any carpeting to make sure that it is firmly attached to the stairs. Fix any carpet that is loose or worn. Avoid having throw rugs at the top or bottom of the stairs. If you do have throw rugs, attach them to the floor with carpet tape. Make sure that you have a light switch at the top of the stairs and the bottom of the stairs. If you do not have them, ask someone to add them for you. What else can I do to help prevent falls? Wear shoes that: Do not have high heels. Have rubber bottoms. Are comfortable and fit you well. Are closed at the toe. Do not wear sandals. If you use a stepladder: Make sure that it is fully opened. Do not climb a closed stepladder. Make sure that both sides of the stepladder are locked into place. Ask someone to hold it for you, if possible. Clearly mark and make sure that you can see: Any grab bars or handrails. First and last  steps. Where the edge of each step is. Use tools that help you move around (mobility aids) if they are needed. These include: Canes. Walkers. Scooters. Crutches. Turn on the lights when you go into a dark area. Replace any light bulbs as soon as they burn out. Set up your furniture so you have a clear path. Avoid moving your furniture around. If any of your floors are uneven, fix them. If there are any pets around you, be aware of where they are. Review your medicines with your doctor. Some medicines can make you feel dizzy. This can increase your chance of falling. Ask your doctor what other things that you can do to help prevent falls. This information is not intended to replace advice given to you by your health care provider. Make sure you discuss any questions you have with your health care provider. Document Released: 11/20/2008 Document Revised: 07/02/2015 Document Reviewed: 02/28/2014 Elsevier Interactive Patient Education  2017 [employer].

## 2022-08-22 NOTE — Progress Notes (Signed)
Subjective:   Olivia [name] is a 67 y.o. female who presents for Medicare Annual (Subsequent) preventive examination.  Visit Complete: Virtual  I connected with  Olivia [name] on 08/22/22 by a audio enabled telemedicine application and verified that I am speaking with the correct person using two identifiers.  Patient Location: Home  Provider Location: Office/Clinic  I discussed the limitations of evaluation and management by telemedicine. The patient expressed understanding and agreed to proceed.  Patient Medicare AWV questionnaire was completed by the patient on 08/20/22; I have confirmed that all information answered by patient is correct and no changes since this date.  Review of Systems     Cardiac Risk Factors include: advanced age (>82men, >25 women);dyslipidemia     Objective:    There were no vitals filed for this visit. There is no height or weight on file to calculate BMI. Per patient no change in vitals since last visit, unable to obtain new vitals due to telehealth visit     08/22/2022    1:01 PM 08/17/2021    1:12 PM 05/27/2020    8:24 AM 11/27/2019   11:18 AM 10/17/2019    1:35 PM 04/29/2019    9:31 AM  Advanced Directives  Does Patient Have a Medical Advance Directive? Yes Yes No;Yes No Yes No  Type of Estate agent of [place];Living will Healthcare Power of [employer] of [place];Living will     Does patient want to make changes to medical advance directive? No - Patient declined       Copy of Healthcare Power of Attorney in Chart? Yes - validated most recent copy scanned in chart (See row information) No - copy requested No - copy requested     Would patient like information on creating a medical advance directive?      No - Patient declined    Current Medications (verified) Outpatient Encounter Medications as of 08/22/2022  Medication Sig   aspirin-acetaminophen-caffeine (EXCEDRIN MIGRAINE) 250-250-65 MG per tablet  Take 1 tablet by mouth every 6 (six) hours as needed.   blood glucose meter kit and supplies Dispense based on patient and insurance preference. Use up to four times daily(FOR ICD-10 E10.9, E11.9).   Cholecalciferol (VITAMIN D HIGH POTENCY PO) Take by mouth. Per pt taking 10,000 units daily   Multiple Vitamins-Minerals (IMMUNE SUPPORT PO) Take 2 capsules by mouth daily.   naproxen sodium (ALEVE) 220 MG tablet Take 220 mg by mouth.   Vitamin D, Ergocalciferol, (DRISDOL) 1.25 MG (50000 UNIT) CAPS capsule Take 1 capsule (50,000 Units total) by mouth every 7 (seven) days.   No facility-administered encounter medications on file as of 08/22/2022.    Allergies (verified) Patient has no known allergies.   History: Past Medical History:  Diagnosis Date   Arthritis    Hyperlipidemia    Pre-diabetes    Past Surgical History:  Procedure Laterality Date   CATARACT EXTRACTION Right 02/26/2021   Dr Vonna Kotyk   CATARACT EXTRACTION Left 04/21/2021   bevis   EYE SURGERY     Family History  Problem Relation Age of Onset   Arthritis Mother    Dementia Mother    Heart disease Father    Stroke Father    Hypertension Father    Diabetes Father    Cancer Father        liver   Social History   Socioeconomic History   Marital status: Married    Spouse name: Not on file  Number of children: 2   Years of education: Not on file   Highest education level: Not on file  Occupational History   Occupation: dietician    Employer: [employer]    Comment: retired   Tobacco Use   Smoking status: Never   Smokeless tobacco: Never  Vaping Use   Vaping status: Never Used  Substance and Sexual Activity   Alcohol use: Yes    Alcohol/week: 2.0 standard drinks of alcohol    Types: 2 Standard drinks or equivalent per week    Comment: Once a week   Drug use: No   Sexual activity: Yes    Partners: Male  Other Topics Concern   Not on file  Social History Narrative   Exercise---  3-4 x a week   Right  Handed   Lives in a two story home   Drinks no caffeine    Social Determinants of Health   Financial Resource Strain: Low Risk  (08/20/2022)   Overall Financial Resource Strain (CARDIA)    Difficulty of Paying Living Expenses: Not hard at all  Food Insecurity: No Food Insecurity (08/20/2022)   Hunger Vital Sign    Worried About Running Out of Food in the Last Year: Never true    Ran Out of Food in the Last Year: Never true  Transportation Needs: No Transportation Needs (08/20/2022)   PRAPARE - Administrator, Civil Service (Medical): No    Lack of Transportation (Non-Medical): No  Physical Activity: Sufficiently Active (08/20/2022)   Exercise Vital Sign    Days of Exercise per Week: 3 days    Minutes of Exercise per Session: 60 min  Stress: No Stress Concern Present (08/20/2022)   [employer] of Occupational Health - Occupational Stress Questionnaire    Feeling of Stress : Not at all  Social Connections: Unknown (08/20/2022)   Social Connection and Isolation Panel [NHANES]    Frequency of Communication with Friends and Family: Once a week    Frequency of Social Gatherings with Friends and Family: Twice a week    Attends Religious Services: Not on Marketing executive or Organizations: Yes    Attends Engineer, structural: More than 4 times per year    Marital Status: Married    Tobacco Counseling Counseling given: Not Answered   Clinical Intake:  Pre-visit preparation completed: Yes  Pain : No/denies pain  Nutritional Risks: None Diabetes: No  How often do you need to have someone help you when you read instructions, pamphlets, or other written materials from your doctor or pharmacy?: 1 - Never  Interpreter Needed?: No  Information entered by :: [employer], CMA   Activities of Daily Living    08/20/2022    4:15 PM  In your present state of health, do you have any difficulty performing the following activities:  Hearing? 0   Comment some slight hearing loss  Vision? 0  Difficulty concentrating or making decisions? 0  Walking or climbing stairs? 0  Dressing or bathing? 0  Doing errands, shopping? 0  Preparing Food and eating ? N  Using the Toilet? N  In the past six months, have you accidently leaked urine? N  Do you have problems with loss of bowel control? N  Managing your Medications? N  Managing your Finances? N  Housekeeping or managing your Housekeeping? N    Patient Care Team: Zola Button, Grayling Congress, DO as PCP - General (Family Medicine) Thomasene Ripple,  DO as PCP - Cardiology (Cardiology) de Freida Busman, [place], OD (Optometry) Erling Cruz., MD as Referring Physician (Gastroenterology) Drema Dallas, DO as Consulting Physician (Neurology)  Indicate any recent Medical Services you may have received from other than [facility] providers in the past year (date may be approximate).     Assessment:   This is a routine wellness examination for [place].  Hearing/Vision screen No results found.  Dietary issues and exercise activities discussed:     Goals Addressed   None    Depression Screen    08/22/2022    1:06 PM 06/13/2022    9:45 AM 08/17/2021    1:11 PM 05/18/2021    9:08 AM 05/27/2020    8:29 AM 05/01/2020    1:09 PM 04/29/2019    9:08 AM  PHQ 2/9 Scores  PHQ - 2 Score 0 0 0 0 0 0 0    Fall Risk    08/20/2022    4:15 PM 06/13/2022    9:45 AM 08/17/2021    1:14 PM 05/18/2021    9:08 AM 05/27/2020    8:28 AM  Fall Risk   Falls in the past year? 0 0 0 0 0  Number falls in past yr: 0 0 0 0 0  Injury with Fall? 0 0 0 0 0  Risk for fall due to : No Fall Risks      Follow up Falls evaluation completed Falls evaluation completed Falls prevention discussed Falls evaluation completed Falls prevention discussed    MEDICARE RISK AT HOME:   TIMED UP AND GO:  Was the test performed?  No    Cognitive Function:    04/29/2019    9:29 AM  MMSE - Mini Mental State Exam  Orientation to time 5   Orientation to Place 5  Registration 3  Attention/ Calculation 5  Recall 3  Language- name 2 objects 2  Language- repeat 1  Language- follow 3 step command 3  Language- read & follow direction 1  Write a sentence 1  Copy design 1  Total score 30        08/22/2022    1:08 PM 08/17/2021    1:16 PM  6CIT Screen  What Year? 0 points 0 points  What month? 0 points 0 points  What time? 0 points 0 points  Count back from 20 0 points 0 points  Months in reverse 0 points 0 points  Repeat phrase 0 points 0 points  Total Score 0 points 0 points    Immunizations Immunization History  Administered Date(s) Administered   Fluad Quad(high Dose 65+) 11/10/2020   Influenza, Seasonal, Injecte, Preservative Fre 11/19/2011   Influenza,inj,Quad PF,6+ Mos 11/02/2012, 11/10/2014   Influenza-Unspecified 11/27/2015, 11/04/2016, 10/09/2018   PFIZER(Purple Top)SARS-COV-2 Vaccination 02/16/2019, 03/07/2019, 12/29/2019   PNEUMOCOCCAL CONJUGATE-20 06/13/2022   Pneumococcal Polysaccharide-23 05/01/2020   Tdap 09/08/2010, 06/25/2021   Zoster Recombinant(Shingrix) 10/31/2017, 03/02/2018    TDAP status: Up to date  Flu Vaccine status: Up to date  Pneumococcal vaccine status: Up to date  Covid-19 vaccine status: Information provided on how to obtain vaccines.   Qualifies for Shingles Vaccine? Yes   Zostavax completed No   Shingrix Completed?: Yes  Screening Tests Health Maintenance  Topic Date Due   COVID-19 Vaccine (4 - 2023-24 season) 10/08/2021   Medicare Annual Wellness (AWV)  08/18/2022   INFLUENZA VACCINE  09/08/2022   MAMMOGRAM  06/20/2023   Colonoscopy  04/04/2026   DTaP/Tdap/Td (3 - Td or Tdap) 06/26/2031  Pneumonia Vaccine 58+ Years old  Completed   DEXA SCAN  Completed   Hepatitis C Screening  Completed   Zoster Vaccines- Shingrix  Completed   HPV VACCINES  Aged Out    Health Maintenance  Health Maintenance Due  Topic Date Due   COVID-19 Vaccine (4 - 2023-24 season)  10/08/2021   Medicare Annual Wellness (AWV)  08/18/2022    Colorectal cancer screening: Type of screening: Colonoscopy. Completed 04/04/16. Repeat every 10 years  Mammogram status: Completed 06/20/22. Repeat every year  Bone Density status: Completed 05/24/21. Results reflect: Bone density results: OSTEOPENIA. Repeat every 2 years.  Lung Cancer Screening: (Low Dose CT Chest recommended if Age 52-80 years, 20 pack-year currently smoking OR have quit w/in 15years.) does not qualify.   Additional Screening:  Hepatitis C Screening: does qualify; Completed 02/16/16  Vision Screening: Recommended annual ophthalmology exams for early detection of glaucoma and other disorders of the eye. Is the patient up to date with their annual eye exam?  Yes  Who is the provider or what is the name of the office in which the patient attends annual eye exams? Dr. Arville Lime If pt is not established with a provider, would they like to be referred to a provider to establish care? No .   Dental Screening: Recommended annual dental exams for proper oral hygiene  Diabetic Foot Exam: N/a  Community Resource Referral / Chronic Care Management: CRR required this visit?  No   CCM required this visit?  No     Plan:     I have personally reviewed and noted the following in the patient's chart:   Medical and social history Use of alcohol, tobacco or illicit drugs  Current medications and supplements including opioid prescriptions. Patient is not currently taking opioid prescriptions. Functional ability and status Nutritional status Physical activity Advanced directives List of other physicians Hospitalizations, surgeries, and ER visits in previous 12 months Vitals Screenings to include cognitive, depression, and falls Referrals and appointments  In addition, I have reviewed and discussed with patient certain preventive protocols, quality metrics, and best practice recommendations. A written personalized care  plan for preventive services as well as general preventive health recommendations were provided to patient.     Donne Anon, CMA   08/22/2022   After Visit Summary: (MyChart) Due to this being a telephonic visit, the after visit summary with patients personalized plan was offered to patient via MyChart   Nurse Notes: None

## 2022-08-29 DIAGNOSIS — D225 Melanocytic nevi of trunk: Secondary | ICD-10-CM | POA: Diagnosis not present

## 2022-08-29 DIAGNOSIS — L821 Other seborrheic keratosis: Secondary | ICD-10-CM | POA: Diagnosis not present

## 2022-08-29 DIAGNOSIS — L814 Other melanin hyperpigmentation: Secondary | ICD-10-CM | POA: Diagnosis not present

## 2022-09-19 DIAGNOSIS — R519 Headache, unspecified: Secondary | ICD-10-CM | POA: Diagnosis not present

## 2022-09-19 DIAGNOSIS — R110 Nausea: Secondary | ICD-10-CM | POA: Diagnosis not present

## 2022-09-19 DIAGNOSIS — U071 COVID-19: Secondary | ICD-10-CM | POA: Diagnosis not present

## 2022-09-19 DIAGNOSIS — R051 Acute cough: Secondary | ICD-10-CM | POA: Diagnosis not present

## 2022-09-19 DIAGNOSIS — R0981 Nasal congestion: Secondary | ICD-10-CM | POA: Diagnosis not present

## 2022-09-19 DIAGNOSIS — R070 Pain in throat: Secondary | ICD-10-CM | POA: Diagnosis not present

## 2022-09-19 DIAGNOSIS — R5381 Other malaise: Secondary | ICD-10-CM | POA: Diagnosis not present

## 2022-09-19 DIAGNOSIS — R509 Fever, unspecified: Secondary | ICD-10-CM | POA: Diagnosis not present

## 2022-09-19 DIAGNOSIS — J029 Acute pharyngitis, unspecified: Secondary | ICD-10-CM | POA: Diagnosis not present

## 2023-01-15 ENCOUNTER — Other Ambulatory Visit: Payer: Self-pay | Admitting: Family Medicine

## 2023-06-15 ENCOUNTER — Ambulatory Visit: Payer: PPO | Admitting: Family Medicine

## 2023-06-15 ENCOUNTER — Other Ambulatory Visit (HOSPITAL_BASED_OUTPATIENT_CLINIC_OR_DEPARTMENT_OTHER): Payer: Self-pay | Admitting: Family Medicine

## 2023-06-15 ENCOUNTER — Encounter: Payer: Self-pay | Admitting: Family Medicine

## 2023-06-15 VITALS — BP 114/68 | HR 76 | Temp 98.1°F | Resp 16 | Ht 64.0 in | Wt 157.8 lb

## 2023-06-15 DIAGNOSIS — R300 Dysuria: Secondary | ICD-10-CM | POA: Diagnosis not present

## 2023-06-15 DIAGNOSIS — Z0000 Encounter for general adult medical examination without abnormal findings: Secondary | ICD-10-CM

## 2023-06-15 DIAGNOSIS — E88819 Insulin resistance, unspecified: Secondary | ICD-10-CM | POA: Diagnosis not present

## 2023-06-15 DIAGNOSIS — R7303 Prediabetes: Secondary | ICD-10-CM

## 2023-06-15 DIAGNOSIS — E785 Hyperlipidemia, unspecified: Secondary | ICD-10-CM | POA: Diagnosis not present

## 2023-06-15 DIAGNOSIS — Z1231 Encounter for screening mammogram for malignant neoplasm of breast: Secondary | ICD-10-CM

## 2023-06-15 DIAGNOSIS — E559 Vitamin D deficiency, unspecified: Secondary | ICD-10-CM | POA: Diagnosis not present

## 2023-06-15 DIAGNOSIS — R8290 Unspecified abnormal findings in urine: Secondary | ICD-10-CM | POA: Diagnosis not present

## 2023-06-15 DIAGNOSIS — R739 Hyperglycemia, unspecified: Secondary | ICD-10-CM | POA: Diagnosis not present

## 2023-06-15 LAB — POC URINALSYSI DIPSTICK (AUTOMATED)
Bilirubin, UA: NEGATIVE
Blood, UA: NEGATIVE
Glucose, UA: NEGATIVE
Ketones, UA: NEGATIVE
Nitrite, UA: NEGATIVE
Protein, UA: NEGATIVE
Spec Grav, UA: 1.01 (ref 1.010–1.025)
Urobilinogen, UA: 0.2 U/dL
pH, UA: 7.5 (ref 5.0–8.0)

## 2023-06-15 LAB — COMPREHENSIVE METABOLIC PANEL WITH GFR
ALT: 14 U/L (ref 0–35)
AST: 16 U/L (ref 0–37)
Albumin: 4.8 g/dL (ref 3.5–5.2)
Alkaline Phosphatase: 87 U/L (ref 39–117)
BUN: 14 mg/dL (ref 6–23)
CO2: 28 meq/L (ref 19–32)
Calcium: 9.6 mg/dL (ref 8.4–10.5)
Chloride: 100 meq/L (ref 96–112)
Creatinine, Ser: 0.83 mg/dL (ref 0.40–1.20)
GFR: 71.95 mL/min (ref >60.00)
Glucose, Bld: 98 mg/dL (ref 70–99)
Potassium: 4.4 meq/L (ref 3.5–5.1)
Sodium: 137 meq/L (ref 135–145)
Total Bilirubin: 0.4 mg/dL (ref 0.2–1.2)
Total Protein: 7.6 g/dL (ref 6.0–8.3)

## 2023-06-15 LAB — CBC WITH DIFFERENTIAL/PLATELET
Basophils Absolute: 0.1 10*3/uL (ref 0.0–0.1)
Basophils Relative: 1 % (ref 0.0–3.0)
Eosinophils Absolute: 0.2 10*3/uL (ref 0.0–0.7)
Eosinophils Relative: 3.4 % (ref 0.0–5.0)
HCT: 41.1 % (ref 36.0–46.0)
Hemoglobin: 13.8 g/dL (ref 12.0–15.0)
Lymphocytes Relative: 27 % (ref 12.0–46.0)
Lymphs Abs: 1.6 10*3/uL (ref 0.7–4.0)
MCHC: 33.7 g/dL (ref 30.0–36.0)
MCV: 89.5 fl (ref 78.0–100.0)
Monocytes Absolute: 0.5 10*3/uL (ref 0.1–1.0)
Monocytes Relative: 7.8 % (ref 3.0–12.0)
Neutro Abs: 3.7 10*3/uL (ref 1.4–7.7)
Neutrophils Relative %: 60.8 % (ref 43.0–77.0)
Platelets: 284 10*3/uL (ref 150.0–400.0)
RBC: 4.59 Mil/uL (ref 3.87–5.11)
RDW: 12.8 % (ref 11.5–15.5)
WBC: 6.1 10*3/uL (ref 4.0–10.5)

## 2023-06-15 LAB — TSH: TSH: 2.42 u[IU]/mL (ref 0.35–5.50)

## 2023-06-15 LAB — LIPID PANEL
Cholesterol: 237 mg/dL — ABNORMAL HIGH (ref 0–200)
HDL: 62.4 mg/dL (ref >39.00)
LDL Cholesterol: 152 mg/dL — ABNORMAL HIGH (ref 0–99)
NonHDL: 174.74
Total CHOL/HDL Ratio: 4
Triglycerides: 112 mg/dL (ref 0.0–149.0)
VLDL: 22.4 mg/dL (ref 0.0–40.0)

## 2023-06-15 LAB — VITAMIN D 25 HYDROXY (VIT D DEFICIENCY, FRACTURES): VITD: 64.43 ng/mL (ref 30.00–100.00)

## 2023-06-15 MED ORDER — VITAMIN D (ERGOCALCIFEROL) 1.25 MG (50000 UNIT) PO CAPS: 50000.0000 [IU] | ORAL_CAPSULE | ORAL | 1 refills | Status: AC

## 2023-06-15 NOTE — Patient Instructions (Signed)
 Preventive Care 43 Years and Older, Female Preventive care refers to lifestyle choices and visits with your health care provider that can promote health and wellness. Preventive care visits are also called wellness exams. What can I expect for my preventive care visit? Counseling Your health care provider may ask you questions about your: Medical history, including: Past medical problems. Family medical history. Pregnancy and menstrual history. History of falls. Current health, including: Memory and ability to understand (cognition). Emotional well-being. Home life and relationship well-being. Sexual activity and sexual health. Lifestyle, including: Alcohol, nicotine or tobacco, and drug use. Access to firearms. Diet, exercise, and sleep habits. Work and work Astronomer. Sunscreen use. Safety issues such as seatbelt and bike helmet use. Physical exam Your health care provider will check your: Height and weight. These may be used to calculate your BMI (body mass index). BMI is a measurement that tells if you are at a healthy weight. Waist circumference. This measures the distance around your waistline. This measurement also tells if you are at a healthy weight and may help predict your risk of certain diseases, such as type 2 diabetes and high blood pressure. Heart rate and blood pressure. Body temperature. Skin for abnormal spots. What immunizations do I need?  Vaccines are usually given at various ages, according to a schedule. Your health care provider will recommend vaccines for you based on your age, medical history, and lifestyle or other factors, such as travel or where you work. What tests do I need? Screening Your health care provider may recommend screening tests for certain conditions. This may include: Lipid and cholesterol levels. Hepatitis C test. Hepatitis B test. HIV (human immunodeficiency virus) test. STI (sexually transmitted infection) testing, if you are at  risk. Lung cancer screening. Colorectal cancer screening. Diabetes screening. This is done by checking your blood sugar (glucose) after you have not eaten for a while (fasting). Mammogram. Talk with your health care provider about how often you should have regular mammograms. BRCA-related cancer screening. This may be done if you have a family history of breast, ovarian, tubal, or peritoneal cancers. Bone density scan. This is done to screen for osteoporosis. Talk with your health care provider about your test results, treatment options, and if necessary, the need for more tests. Follow these instructions at home: Eating and drinking  Eat a diet that includes fresh fruits and vegetables, whole grains, lean protein, and low-fat dairy products. Limit your intake of foods with high amounts of sugar, saturated fats, and salt. Take vitamin and mineral supplements as recommended by your health care provider. Do not drink alcohol if your health care provider tells you not to drink. If you drink alcohol: Limit how much you have to 0-1 drink a day. Know how much alcohol is in your drink. In the [place] one drink equals one 12 oz bottle of beer (355 mL), one 5 oz glass of wine (148 mL), or one 1 oz glass of hard liquor (44 mL). Lifestyle Brush your teeth every morning and night with fluoride toothpaste. Floss one time each day. Exercise for at least 30 minutes 5 or more days each week. Do not use any products that contain nicotine or tobacco. These products include cigarettes, chewing tobacco, and vaping devices, such as e-cigarettes. If you need help quitting, ask your health care provider. Do not use drugs. If you are sexually active, practice safe sex. Use a condom or other form of protection in order to prevent STIs. Take aspirin only as told by  your health care provider. Make sure that you understand how much to take and what form to take. Work with your health care provider to find out whether it  is safe and beneficial for you to take aspirin daily. Ask your health care provider if you need to take a cholesterol-lowering medicine (statin). Find healthy ways to manage stress, such as: Meditation, yoga, or listening to music. Journaling. Talking to a trusted person. Spending time with friends and family. Minimize exposure to UV radiation to reduce your risk of skin cancer. Safety Always wear your seat belt while driving or riding in a vehicle. Do not drive: If you have been drinking alcohol. Do not ride with someone who has been drinking. When you are tired or distracted. While texting. If you have been using any mind-altering substances or drugs. Wear a helmet and other protective equipment during sports activities. If you have firearms in your house, make sure you follow all gun safety procedures. What's next? Visit your health care provider once a year for an annual wellness visit. Ask your health care provider how often you should have your eyes and teeth checked. Stay up to date on all vaccines. This information is not intended to replace advice given to you by your health care provider. Make sure you discuss any questions you have with your health care provider. Document Revised: 07/22/2020 Document Reviewed: 07/22/2020 Elsevier Patient Education  2024 [employer].

## 2023-06-15 NOTE — Progress Notes (Addendum)
 Established Patient Office Visit  Subjective                                                                                                                                                                                                                                                                                                                                                                                                                                                                                                                                                                                                               Patient ID: Olivia  Manley [name], female    DOB: 11-14-1955  Age: 68 y.o. MRN: [id number]  Chief Complaint  Patient presents with   Annual Exam    Pt states fasting     HPI Discussed the use of AI scribe software for clinical note transcription with the patient, who gave verbal consent to proceed.  History of Present Illness Olivia [name] is a 68 year old female who presents for cpe.   She reports vaginal irritation and burning after intercourse, lasting for a day or two. She uses KY Jelly as a lubricant due to vaginal dryness but finds it insufficient to prevent irritation. She has not discussed this with her partner to avoid causing concern.  She has a history of cataract surgery in January and March, after which she no longer requires corrective lenses but uses reading glasses with a 1.25 prescription. She reports increased sensitivity to light post-surgery and frequently wears sunglasses.  She takes vitamin D  daily, which is over-the-counter, and uses Aleve as needed, approximately twice a year. She no longer experiences migraines since retiring.  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   Patient Active Problem  List   Diagnosis Date Noted   Pre-diabetes    Left shoulder pain 03/13/2020   Trigger thumb, left thumb 03/13/2020   Nonspecific abnormal electrocardiogram (ECG) (EKG) 05/02/2019   Mixed hyperlipidemia 05/02/2019   Partial nontraumatic rupture of right rotator cuff 10/02/2018   Acromioclavicular arthrosis 04/23/2018   Loss of transverse plantar arch 04/23/2018   Non-recurrent acute serous otitis media of left ear 04/23/2018   Right wrist injury, subsequent encounter 05/14/2017   Preventative health care 04/21/2017   Migraine without aura and without status migrainosus, not intractable 10/17/2016   Past Medical History:  Diagnosis Date   Arthritis    Hyperlipidemia    Pre-diabetes    Past Surgical History:  Procedure Laterality Date   CATARACT EXTRACTION Right 02/26/2021   Dr Lenna Quinton   CATARACT EXTRACTION Left 04/21/2021   bevis   EYE SURGERY     Social History   Tobacco Use   Smoking status: Never   Smokeless tobacco: Never  Vaping Use   Vaping status: Never Used  Substance Use Topics   Alcohol use: Yes    Alcohol/week: 2.0 standard drinks of alcohol    Types: 2 Standard drinks or equivalent per week    Comment: Once a week   Drug use: No   Social History   Socioeconomic History   Marital status: Married    Spouse name: Not on file   Number of children: 2   Years of education: Not on file   Highest education level: Not on file  Occupational History   Occupation: dietician    Employer: [employer]    Comment: retired   Tobacco Use   Smoking status: Never   Smokeless tobacco: Never  Vaping Use   Vaping status: Never Used  Substance and Sexual Activity   Alcohol use: Yes    Alcohol/week: 2.0 standard drinks of alcohol    Types: 2 Standard drinks or equivalent per week    Comment: Once a week   Drug use: No   Sexual activity: Yes    Partners: Male  Other Topics Concern   Not on file  Social History Narrative   Exercise---  3-4 x a week   Right Handed    Lives  in a two story home   Drinks no caffeine    Social Drivers of Corporate investment banker Strain: Low Risk  (08/20/2022)   Overall Financial Resource Strain (CARDIA)    Difficulty of Paying Living Expenses: Not hard at all  Food Insecurity: No Food Insecurity (08/20/2022)   Hunger Vital Sign    Worried About Running Out of Food in the Last Year: Never true    Ran Out of Food in the Last Year: Never true  Transportation Needs: No Transportation Needs (08/20/2022)   PRAPARE - Administrator, Civil Service (Medical): No    Lack of Transportation (Non-Medical): No  Physical Activity: Sufficiently Active (08/20/2022)   Exercise Vital Sign    Days of Exercise per Week: 3 days    Minutes of Exercise per Session: 60 min  Stress: No Stress Concern Present (08/20/2022)   [employer] of Occupational Health - Occupational Stress Questionnaire    Feeling of Stress : Not at all  Social Connections: Unknown (08/20/2022)   Social Connection and Isolation Panel [NHANES]    Frequency of Communication with Friends and Family: Once a week    Frequency of Social Gatherings with Friends and Family: Twice a week    Attends Religious Services: Not on Insurance claims handler of Clubs or Organizations: Yes    Attends Banker Meetings: More than 4 times per year    Marital Status: Married  Catering manager Violence: Not At Risk (08/17/2021)   Humiliation, Afraid, Rape, and Kick questionnaire    Fear of Current or Ex-Partner: No    Emotionally Abused: No    Physically Abused: No    Sexually Abused: No   Family Status  Relation Name Status   Mother Cathay Clonts Deceased at age 72       renal failure   Father Clearence Curet Deceased at age 39       liver   Brother  Deceased at age 51        house fire  No partnership data on file   Family History  Problem Relation Age of Onset   Arthritis Mother    Dementia Mother    Heart disease Father     Stroke Father    Hypertension Father    Diabetes Father    Cancer Father        liver   No Known Allergies    Review of Systems  Constitutional:  Negative for chills, fever and malaise/fatigue.  HENT:  Negative for congestion and hearing loss.   Eyes:  Negative for blurred vision and discharge.  Respiratory:  Negative for cough, sputum production and shortness of breath.   Cardiovascular:  Negative for chest pain, palpitations and leg swelling.  Gastrointestinal:  Negative for abdominal pain, blood in stool, constipation, diarrhea, heartburn, nausea and vomiting.  Genitourinary:  Negative for dysuria, frequency, hematuria and urgency.  Musculoskeletal:  Negative for back pain, falls and myalgias.  Skin:  Negative for rash.  Neurological:  Negative for dizziness, sensory change, loss of consciousness, weakness and headaches.  Endo/Heme/Allergies:  Negative for environmental allergies. Does not bruise/bleed easily.  Psychiatric/Behavioral:  Negative for depression and suicidal ideas. The patient is not nervous/anxious and does not have insomnia.       Objective:     BP 114/68 (BP Location: Left Arm, Patient Position: Sitting, Cuff Size: Normal)   Pulse 76   Temp 98.1 F (36.7 C) (Oral)  Resp 16   Ht 5\' 4"  (1.626 m)   Wt 157 lb 12.8 oz (71.6 kg)   SpO2 98%   BMI 27.09 kg/m  BP Readings from Last 3 Encounters:  06/15/23 114/68  06/13/22 118/80  05/18/21 120/74   Wt Readings from Last 3 Encounters:  06/15/23 157 lb 12.8 oz (71.6 kg)  06/13/22 159 lb 9.6 oz (72.4 kg)  05/18/21 163 lb 9.6 oz (74.2 kg)   SpO2 Readings from Last 3 Encounters:  06/15/23 98%  06/13/22 98%  05/18/21 98%      Physical Exam Vitals and nursing note reviewed.  Constitutional:      General: She is not in acute distress.    Appearance: Normal appearance. She is well-developed.  HENT:     Head: Normocephalic and atraumatic.     Right Ear: Tympanic membrane, ear canal and external ear  normal. There is no impacted cerumen.     Left Ear: Tympanic membrane, ear canal and external ear normal. There is no impacted cerumen.     Nose: Nose normal.     Mouth/Throat:     Mouth: Mucous membranes are moist.     Pharynx: Oropharynx is clear. No oropharyngeal exudate or posterior oropharyngeal erythema.  Eyes:     General: No scleral icterus.       Right eye: No discharge.        Left eye: No discharge.     Conjunctiva/sclera: Conjunctivae normal.     Pupils: Pupils are equal, round, and reactive to light.  Neck:     Thyroid : No thyromegaly or thyroid  tenderness.     Vascular: No JVD.  Cardiovascular:     Rate and Rhythm: Normal rate and regular rhythm.     Heart sounds: Normal heart sounds. No murmur heard. Pulmonary:     Effort: Pulmonary effort is normal. No respiratory distress.     Breath sounds: Normal breath sounds.  Abdominal:     General: Bowel sounds are normal. There is no distension.     Palpations: Abdomen is soft. There is no mass.     Tenderness: There is no abdominal tenderness. There is no guarding or rebound.  Genitourinary:    Vagina: Normal.  Musculoskeletal:        General: Swelling: 43. Normal range of motion.     Cervical back: Normal range of motion and neck supple.     Right lower leg: No edema.     Left lower leg: No edema.  Lymphadenopathy:     Cervical: No cervical adenopathy.  Skin:    General: Skin is warm and dry.     Findings: No erythema or rash.  Neurological:     Mental Status: She is alert and oriented to person, place, and time.     Cranial Nerves: No cranial nerve deficit.     Deep Tendon Reflexes: Reflexes are normal and symmetric.  Psychiatric:        Mood and Affect: Mood normal.        Behavior: Behavior normal.        Thought Content: Thought content normal.        Judgment: Judgment normal.     Results for orders placed or performed in visit on 06/15/23  CBC with Differential/Platelet  Result Value Ref Range   WBC  6.1 4.0 - 10.5 K/uL   RBC 4.59 3.87 - 5.11 Mil/uL   Hemoglobin 13.8 12.0 - 15.0 g/dL   HCT 19.1 47.8 - 29.5 %  MCV 89.5 78.0 - 100.0 fl   MCHC 33.7 30.0 - 36.0 g/dL   RDW 16.1 09.6 - 04.5 %   Platelets 284.0 150.0 - 400.0 K/uL   Neutrophils Relative % 60.8 43.0 - 77.0 %   Lymphocytes Relative 27.0 12.0 - 46.0 %   Monocytes Relative 7.8 3.0 - 12.0 %   Eosinophils Relative 3.4 0.0 - 5.0 %   Basophils Relative 1.0 0.0 - 3.0 %   Neutro Abs 3.7 1.4 - 7.7 K/uL   Lymphs Abs 1.6 0.7 - 4.0 K/uL   Monocytes Absolute 0.5 0.1 - 1.0 K/uL   Eosinophils Absolute 0.2 0.0 - 0.7 K/uL   Basophils Absolute 0.1 0.0 - 0.1 K/uL  Comprehensive metabolic panel with GFR  Result Value Ref Range   Sodium 137 135 - 145 mEq/L   Potassium 4.4 3.5 - 5.1 mEq/L   Chloride 100 96 - 112 mEq/L   CO2 28 19 - 32 mEq/L   Glucose, Bld 98 70 - 99 mg/dL   BUN 14 6 - 23 mg/dL   Creatinine, Ser 4.09 0.40 - 1.20 mg/dL   Total Bilirubin 0.4 0.2 - 1.2 mg/dL   Alkaline Phosphatase 87 39 - 117 U/L   AST 16 0 - 37 U/L   ALT 14 0 - 35 U/L   Total Protein 7.6 6.0 - 8.3 g/dL   Albumin 4.8 3.5 - 5.2 g/dL   GFR 81.19 >14.78 mL/min   Calcium 9.6 8.4 - 10.5 mg/dL  Lipid panel  Result Value Ref Range   Cholesterol 237 (H) 0 - 200 mg/dL   Triglycerides 295.6 0.0 - 149.0 mg/dL   HDL 21.30 >86.57 mg/dL   VLDL 84.6 0.0 - 96.2 mg/dL   LDL Cholesterol 952 (H) 0 - 99 mg/dL   Total CHOL/HDL Ratio 4    NonHDL 174.74   TSH  Result Value Ref Range   TSH 2.42 0.35 - 5.50 uIU/mL  VITAMIN D  25 Hydroxy (Vit-D Deficiency, Fractures)  Result Value Ref Range   VITD 64.43 30.00 - 100.00 ng/mL  POCT Urinalysis Dipstick (Automated)  Result Value Ref Range   Color, UA yellow    Clarity, UA slightly cloudy    Glucose, UA Negative Negative   Bilirubin, UA Negative    Ketones, UA Negative    Spec Grav, UA 1.010 1.010 - 1.025   Blood, UA Negative    pH, UA 7.5 5.0 - 8.0   Protein, UA Negative Negative   Urobilinogen, UA 0.2 0.2 or 1.0  E.U./dL   Nitrite, UA Negative    Leukocytes, UA Large (3+) (A) Negative    Last CBC Lab Results  Component Value Date   WBC 6.1 06/15/2023   HGB 13.8 06/15/2023   HCT 41.1 06/15/2023   MCV 89.5 06/15/2023   MCH 29.8 10/17/2019   RDW 12.8 06/15/2023   PLT 284.0 06/15/2023   Last metabolic panel Lab Results  Component Value Date   GLUCOSE 98 06/15/2023   NA 137 06/15/2023   K 4.4 06/15/2023   CL 100 06/15/2023   CO2 28 06/15/2023   BUN 14 06/15/2023   CREATININE 0.83 06/15/2023   GFR 71.95 06/15/2023   CALCIUM 9.6 06/15/2023   PROT 7.6 06/15/2023   ALBUMIN 4.8 06/15/2023   BILITOT 0.4 06/15/2023   ALKPHOS 87 06/15/2023   AST 16 06/15/2023   ALT 14 06/15/2023   ANIONGAP 10 10/17/2019   Last lipids Lab Results  Component Value Date   CHOL 237 (H) 06/15/2023  HDL 62.40 06/15/2023   LDLCALC 152 (H) 06/15/2023   LDLDIRECT 164.5 11/21/2012   TRIG 112.0 06/15/2023   CHOLHDL 4 06/15/2023   Last hemoglobin A1c Lab Results  Component Value Date   HGBA1C 6.0 06/13/2022   Last thyroid  functions Lab Results  Component Value Date   TSH 2.42 06/15/2023   Last vitamin D  Lab Results  Component Value Date   VD25OH 64.43 06/15/2023   Last vitamin B12 and Folate No results found for: "VITAMINB12", "FOLATE"    The 10-year ASCVD risk score (Arnett DK, et al., 2019) is: 7.1%    Assessment & Plan:   Problem List Items Addressed This Visit       Unprioritized   Preventative health care - Primary   Ghm utd Check labs  See AVS Health Maintenance  Topic Date Due   COVID-19 Vaccine (4 - 2024-25 season) 10/09/2022   MAMMOGRAM  06/20/2023   Medicare Annual Wellness (AWV)  08/22/2023   INFLUENZA VACCINE  09/08/2023   Colonoscopy  04/04/2026   DTaP/Tdap/Td (3 - Td or Tdap) 06/26/2031   Pneumonia Vaccine 51+ Years old  Completed   DEXA SCAN  Completed   Hepatitis C Screening  Completed   Zoster Vaccines- Shingrix  Completed   HPV VACCINES  Aged Out    Meningococcal B Vaccine  Aged Out         Pre-diabetes   Con't with diet and exercise Check labs       Relevant Orders   CBC with Differential/Platelet (Completed)   Comprehensive metabolic panel with GFR (Completed)   Lipid panel (Completed)   TSH (Completed)   VITAMIN D  25 Hydroxy (Vit-D Deficiency, Fractures) (Completed)   Insulin , random   Other Visit Diagnoses       Vitamin D  deficiency       Relevant Medications   Vitamin D , Ergocalciferol , (DRISDOL ) 1.25 MG (50000 UNIT) CAPS capsule   Other Relevant Orders   Insulin , random     Hyperglycemia       Relevant Orders   Comprehensive metabolic panel with GFR (Completed)     Hyperlipidemia, unspecified hyperlipidemia type       Relevant Orders   CBC with Differential/Platelet (Completed)   Comprehensive metabolic panel with GFR (Completed)   Lipid panel (Completed)     Insulin  resistance       Relevant Orders   Insulin , random     Dysuria       Relevant Orders   POCT Urinalysis Dipstick (Automated) (Completed)   Urine Culture     Abnormal urinalysis       Relevant Orders   Urine Culture     Assessment and Plan Assessment & Plan Vaginal dryness   She experiences vaginal dryness causing irritation and burning sensation after intercourse, lasting one to two days. She currently has no gynecologist. Discussed using a vaginal moisturizer as a first-line treatment. If symptoms persist, consider referral to a gynecologist. Use a vaginal moisturizer daily.  Cataracts   She underwent cataract surgeries in January and March and no longer needs corrective lenses except for reading glasses. She reports light sensitivity post-surgery, which is managed with sunglasses. No post-surgical complications are reported. Continue regular follow-up with an ophthalmologist.  Wellness Visit   During her routine wellness visit, we discussed her general health, family history, and recent cataract surgeries. There is no new family history  or surgeries. She engages in regular exercise and has started a nonprofit for adaptive paddling. She has not experienced migraines  since retirement and occasionally uses Aleve and Excedrin as needed. She takes daily vitamin D  but no calcium supplementation. She is sensitive to light post-cataract surgery and uses sunglasses regularly. Maintain her current exercise regimen, continue daily vitamin D  supplementation, and encourage regular follow-up with her ophthalmologist and dentist.    Return in about 6 months (around 12/16/2023), or if symptoms worsen or fail to improve.    Rueben Kassim R Lowne Chase, DO

## 2023-06-15 NOTE — Assessment & Plan Note (Signed)
 Con't with diet and exercise Check labs

## 2023-06-15 NOTE — Assessment & Plan Note (Signed)
 Ghm utd Check labs  See AVS Health Maintenance  Topic Date Due   COVID-19 Vaccine (4 - 2024-25 season) 10/09/2022   MAMMOGRAM  06/20/2023   Medicare Annual Wellness (AWV)  08/22/2023   INFLUENZA VACCINE  09/08/2023   Colonoscopy  04/04/2026   DTaP/Tdap/Td (3 - Td or Tdap) 06/26/2031   Pneumonia Vaccine 86+ Years old  Completed   DEXA SCAN  Completed   Hepatitis C Screening  Completed   Zoster Vaccines- Shingrix  Completed   HPV VACCINES  Aged Out   Meningococcal B Vaccine  Aged Out

## 2023-06-16 LAB — INSULIN, RANDOM: Insulin: 13.8 u[IU]/mL

## 2023-06-17 LAB — URINE CULTURE
MICRO NUMBER:: 16430819
Result:: NO GROWTH
SPECIMEN QUALITY:: ADEQUATE

## 2023-06-22 ENCOUNTER — Other Ambulatory Visit: Payer: Self-pay | Admitting: Family Medicine

## 2023-06-22 ENCOUNTER — Other Ambulatory Visit (INDEPENDENT_AMBULATORY_CARE_PROVIDER_SITE_OTHER)

## 2023-06-22 ENCOUNTER — Encounter (HOSPITAL_BASED_OUTPATIENT_CLINIC_OR_DEPARTMENT_OTHER): Payer: Self-pay

## 2023-06-22 ENCOUNTER — Encounter: Payer: Self-pay | Admitting: Family Medicine

## 2023-06-22 ENCOUNTER — Ambulatory Visit (HOSPITAL_BASED_OUTPATIENT_CLINIC_OR_DEPARTMENT_OTHER)
Admission: RE | Admit: 2023-06-22 | Discharge: 2023-06-22 | Disposition: A | Discharge status: Home / Self Care | Source: Ambulatory Visit | Attending: Family Medicine | Admitting: Family Medicine

## 2023-06-22 DIAGNOSIS — R739 Hyperglycemia, unspecified: Secondary | ICD-10-CM

## 2023-06-22 DIAGNOSIS — Z1231 Encounter for screening mammogram for malignant neoplasm of breast: Secondary | ICD-10-CM | POA: Diagnosis not present

## 2023-06-23 ENCOUNTER — Ambulatory Visit: Payer: Self-pay | Admitting: Family Medicine

## 2023-06-23 LAB — HEMOGLOBIN A1C: Hgb A1c MFr Bld: 5.9 % (ref 4.6–6.5)

## 2023-06-26 ENCOUNTER — Ambulatory Visit: Payer: Self-pay | Admitting: Family Medicine

## 2023-07-13 DIAGNOSIS — Z961 Presence of intraocular lens: Secondary | ICD-10-CM | POA: Diagnosis not present

## 2023-08-17 ENCOUNTER — Encounter: Payer: Self-pay | Admitting: Family Medicine

## 2023-08-23 ENCOUNTER — Ambulatory Visit

## 2023-08-31 ENCOUNTER — Ambulatory Visit

## 2023-09-01 ENCOUNTER — Ambulatory Visit

## 2023-09-18 ENCOUNTER — Ambulatory Visit (INDEPENDENT_AMBULATORY_CARE_PROVIDER_SITE_OTHER): Admitting: *Deleted

## 2023-09-18 VITALS — Ht 64.0 in | Wt 157.0 lb

## 2023-09-18 DIAGNOSIS — Z0000 Encounter for general adult medical examination without abnormal findings: Secondary | ICD-10-CM | POA: Diagnosis not present

## 2023-09-18 NOTE — Patient Instructions (Addendum)
 Ms. Panella , Thank you for taking time out of your busy schedule to complete your Annual Wellness Visit with me. I enjoyed our conversation and look forward to speaking with you again next year. I, as well as your care team,  appreciate your ongoing commitment to your health goals. Please review the following plan we discussed and let me know if I can assist you in the future. Your Game plan/ To Do List   Keep up the good work with staying physically active! 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   Follow up Visits: Next Medicare AWV with our clinical staff:   09/19/24 9:40am  Next Office Visit with your provider: 06/17/24 9:40am  Clinician Recommendations:  Aim for 30 minutes of exercise or brisk walking, 6-8 glasses of water, and 5 servings of fruits and vegetables each day.   You will need to get the following vaccines at your local pharmacy: flu      This is a list of the screening recommended for you and due dates:  Health Maintenance  Topic Date Due   COVID-19 Vaccine (4 - 2024-25 season) 10/09/2022   Medicare Annual Wellness Visit  08/22/2023   Flu Shot  09/08/2023   Mammogram  06/21/2024   Colon Cancer Screening  04/04/2026   DTaP/Tdap/Td vaccine (3 - Td or Tdap) 06/26/2031   Pneumococcal Vaccine for age over 60  Completed   DEXA scan (bone density measurement)  Completed   Hepatitis C Screening  Completed   Zoster (Shingles) Vaccine  Completed   Hepatitis B Vaccine  Aged Out   HPV Vaccine  Aged Out   Meningitis B Vaccine  Aged Out    See attachments for Preventive Care and Fall Prevention Tips.

## 2023-09-18 NOTE — Progress Notes (Signed)
 Subjective:   Olivia [name] is a 68 y.o. who presents for a Medicare Wellness preventive visit.  As a reminder, Annual Wellness Visits don't include a physical exam, and some assessments may be limited, especially if this visit is performed virtually. We may recommend an in-person follow-up visit with your provider if needed.  Visit Complete: Virtual I connected with  Olivia [name] on 09/18/23 by a audio enabled telemedicine application and verified that I am speaking with the correct person using two identifiers.  Patient Location: Home  Provider Location: Office/Clinic  I discussed the limitations of evaluation and management by telemedicine. The patient expressed understanding and agreed to proceed.  Vital Signs: Because this visit was a virtual/telehealth visit, some criteria may be missing or patient reported. Any vitals not documented were not able to be obtained and vitals that have been documented are patient reported.  VideoDeclined- This patient declined Librarian, academic. Therefore the visit was completed with audio only.  Persons Participating in Visit: Patient.  AWV Questionnaire: No: Patient Medicare AWV questionnaire was not completed prior to this visit.  Cardiac Risk Factors include: advanced age (>50men, >17 women);dyslipidemia     Objective:    Today's Vitals   09/18/23 0942  Weight: 157 lb (71.2 kg)  Height: 5' 4 (1.626 m)   Body mass index is 26.95 kg/m.     09/18/2023   10:01 AM 08/22/2022    1:01 PM 08/17/2021    1:12 PM 05/27/2020    8:24 AM 11/27/2019   11:18 AM 10/17/2019    1:35 PM 04/29/2019    9:31 AM  Advanced Directives  Does Patient Have a Medical Advance Directive? Yes Yes Yes No;Yes No Yes No  Type of Estate agent of [employer] Power of [place];Living will Healthcare Power of [employer] of [place];Living will     Does patient want to make changes to  medical advance directive? No - Patient declined No - Patient declined       Copy of Healthcare Power of Attorney in Chart? No - copy requested Yes - validated most recent copy scanned in chart (See row information) No - copy requested No - copy requested     Would patient like information on creating a medical advance directive?       No - Patient declined    Current Medications (verified) Outpatient Encounter Medications as of 09/18/2023  Medication Sig   cholecalciferol (VITAMIN D3) 25 MCG (1000 UNIT) tablet Take 1,000 Units by mouth daily.   Vitamin D, Ergocalciferol, (DRISDOL) 1.25 MG (50000 UNIT) CAPS capsule Take 1 capsule (50,000 Units total) by mouth every 7 (seven) days.   [DISCONTINUED] Cholecalciferol (VITAMIN D HIGH POTENCY PO) Take by mouth. Per pt taking 10,000 units daily   No facility-administered encounter medications on file as of 09/18/2023.    Allergies (verified) Patient has no known allergies.   History: Past Medical History:  Diagnosis Date   Arthritis    Hyperlipidemia    Pre-diabetes    Past Surgical History:  Procedure Laterality Date   CATARACT EXTRACTION Right 02/26/2021   Dr Lavonia   CATARACT EXTRACTION Left 04/21/2021   bevis   EYE SURGERY     Family History  Problem Relation Age of Onset   Arthritis Mother    Dementia Mother    Heart disease Father    Stroke Father    Hypertension Father    Diabetes Father    Cancer Father  liver   Social History   Socioeconomic History   Marital status: Married    Spouse name: Not on file   Number of children: 2   Years of education: Not on file   Highest education level: Not on file  Occupational History   Occupation: dietician    Employer: [employer]    Comment: retired   Tobacco Use   Smoking status: Never   Smokeless tobacco: Never  Vaping Use   Vaping status: Never Used  Substance and Sexual Activity   Alcohol use: Yes    Alcohol/week: 2.0 standard drinks of alcohol    Types: 2  Standard drinks or equivalent per week    Comment: Once a week   Drug use: No   Sexual activity: Yes    Partners: Male  Other Topics Concern   Not on file  Social History Narrative   Exercise---  3-4 x a week   Right Handed   Lives in a two story home   Drinks no caffeine    Social Drivers of Corporate investment banker Strain: Low Risk  (09/18/2023)   Overall Financial Resource Strain (CARDIA)    Difficulty of Paying Living Expenses: Not very hard  Food Insecurity: No Food Insecurity (09/18/2023)   Hunger Vital Sign    Worried About Running Out of Food in the Last Year: Never true    Ran Out of Food in the Last Year: Never true  Transportation Needs: No Transportation Needs (09/18/2023)   PRAPARE - Administrator, Civil Service (Medical): No    Lack of Transportation (Non-Medical): No  Physical Activity: Sufficiently Active (09/18/2023)   Exercise Vital Sign    Days of Exercise per Week: 3 days    Minutes of Exercise per Session: 60 min  Stress: No Stress Concern Present (09/18/2023)   [employer] of Occupational Health - Occupational Stress Questionnaire    Feeling of Stress: Not at all  Social Connections: Moderately Integrated (09/18/2023)   Social Connection and Isolation Panel    Frequency of Communication with Friends and Family: Once a week    Frequency of Social Gatherings with Friends and Family: Twice a week    Attends Religious Services: Never    Database administrator or Organizations: Yes    Attends Engineer, structural: More than 4 times per year    Marital Status: Married    Tobacco Counseling Counseling given: Not Answered    Clinical Intake:  Pre-visit preparation completed: Yes  Pain : No/denies pain     BMI - recorded: 26.95 Nutritional Status: BMI 25 -29 Overweight Nutritional Risks: None Diabetes: No  Lab Results  Component Value Date   HGBA1C 5.9 06/22/2023   HGBA1C 6.0 06/13/2022   HGBA1C 6.0 05/18/2021      How often do you need to have someone help you when you read instructions, pamphlets, or other written materials from your doctor or pharmacy?: 1 - Never  Interpreter Needed?: No  Information entered by :: Lolita Libra, CMA   Activities of Daily Living     09/18/2023    9:56 AM  In your present state of health, do you have any difficulty performing the following activities:  Hearing? 0  Vision? 0  Difficulty concentrating or making decisions? 0  Walking or climbing stairs? 0  Dressing or bathing? 0  Doing errands, shopping? 0  Preparing Food and eating ? N  Using the Toilet? N  In the past six  months, have you accidently leaked urine? N  Do you have problems with loss of bowel control? N  Managing your Medications? N  Managing your Finances? N  Housekeeping or managing your Housekeeping? N    Patient Care Team: Antonio Meth, Jamee SAUNDERS, DO as PCP - General (Family Medicine) Ladora Gunnar DEL., MD as Referring Physician (Gastroenterology) Skeet Juliene SAUNDERS, DO as Consulting Physician (Neurology) Adornetto, Olam Amble, DMD (Dentistry) de Dasie Bodily, OD (Optometry)  I have updated your Care Teams any recent Medical Services you may have received from other providers in the past year.     Assessment:   This is a routine wellness examination for Olivia [name].  Hearing/Vision screen Hearing Screening - Comments:: Notes a decrease but stable over the last year    Vision Screening - Comments:: Wears RX glasses -- up to date with routine eye exams.    Goals Addressed               This Visit's Progress     Patient Stated (pt-stated)        Maintain health and exercise.       Depression Screen     09/18/2023    9:53 AM 06/15/2023    9:12 AM 08/22/2022    1:06 PM 06/13/2022    9:45 AM 08/17/2021    1:11 PM 05/18/2021    9:08 AM 05/27/2020    8:29 AM  PHQ 2/9 Scores  PHQ - 2 Score 0 0 0 0 0 0 0  PHQ- 9 Score 0          Fall Risk     09/18/2023    9:47 AM 06/15/2023     9:12 AM 08/20/2022    4:15 PM 06/13/2022    9:45 AM 08/17/2021    1:14 PM  Fall Risk   Falls in the past year? 1 0 0 0 0  Comment tripped over an object      Number falls in past yr: 0 0 0 0 0  Injury with Fall? 0 0 0 0 0  Risk for fall due to : No Fall Risks  No Fall Risks    Follow up Education provided Falls evaluation completed Falls evaluation completed Falls evaluation completed Falls prevention discussed      Data saved with a previous flowsheet row definition    MEDICARE RISK AT HOME:  Medicare Risk at Home Any stairs in or around the home?: Yes If so, are there any without handrails?: No Home free of loose throw rugs in walkways, pet beds, electrical cords, etc?: Yes Adequate lighting in your home to reduce risk of falls?: Yes Life alert?: No Use of a cane, walker or w/c?: No Grab bars in the bathroom?: No Shower chair or bench in shower?: Yes Elevated toilet seat or a handicapped toilet?: Yes  TIMED UP AND GO:  Was the test performed?  No  Cognitive Function: 6CIT completed    04/29/2019    9:29 AM  MMSE - Mini Mental State Exam  Orientation to time 5  Orientation to Place 5  Registration 3  Attention/ Calculation 5  Recall 3  Language- name 2 objects 2  Language- repeat 1  Language- follow 3 step command 3  Language- read & follow direction 1  Write a sentence 1  Copy design 1  Total score 30        09/18/2023    9:57 AM 08/22/2022    1:08 PM 08/17/2021    1:16  PM  6CIT Screen  What Year? 0 points 0 points 0 points  What month? 0 points 0 points 0 points  What time? 0 points 0 points 0 points  Count back from 20 0 points 0 points 0 points  Months in reverse 0 points 0 points 0 points  Repeat phrase 0 points 0 points 0 points  Total Score 0 points 0 points 0 points    Immunizations Immunization History  Administered Date(s) Administered   Fluad Quad(high Dose 65+) 11/10/2020   Influenza, Seasonal, Injecte, Preservative Fre 11/19/2011    Influenza,inj,Quad PF,6+ Mos 11/02/2012, 11/10/2014   Influenza-Unspecified 11/27/2015, 11/04/2016, 10/09/2018   PFIZER(Purple Top)SARS-COV-2 Vaccination 02/16/2019, 03/07/2019, 12/29/2019   PNEUMOCOCCAL CONJUGATE-20 06/13/2022   Pneumococcal Polysaccharide-23 05/01/2020   Tdap 09/08/2010, 06/25/2021   Zoster Recombinant(Shingrix) 10/31/2017, 03/02/2018    Screening Tests Health Maintenance  Topic Date Due   INFLUENZA VACCINE  09/08/2023   COVID-19 Vaccine (4 - 2024-25 season) 09/17/2024 (Originally 10/09/2022)   MAMMOGRAM  06/21/2024   Medicare Annual Wellness (AWV)  09/17/2024   Colonoscopy  04/04/2026   DTaP/Tdap/Td (3 - Td or Tdap) 06/26/2031   Pneumococcal Vaccine: 50+ Years  Completed   DEXA SCAN  Completed   Hepatitis C Screening  Completed   Zoster Vaccines- Shingrix  Completed   Hepatitis B Vaccines  Aged Out   HPV VACCINES  Aged Out   Meningococcal B Vaccine  Aged Out    Health Maintenance  Health Maintenance Due  Topic Date Due   INFLUENZA VACCINE  09/08/2023   Health Maintenance Items Addressed: Will consider flu vaccine at pharmacy. All other HM up to date.  Additional Screening:  Vision Screening: Recommended annual ophthalmology exams for early detection of glaucoma and other disorders of the eye. Would you like a referral to an eye doctor? No    Dental Screening: Recommended annual dental exams for proper oral hygiene  Community Resource Referral / Chronic Care Management: CRR required this visit?  No   CCM required this visit?  No   Plan:    I have personally reviewed and noted the following in the patient's chart:   Medical and social history Use of alcohol, tobacco or illicit drugs  Current medications and supplements including opioid prescriptions. Patient is not currently taking opioid prescriptions. Functional ability and status Nutritional status Physical activity Advanced directives List of other physicians Hospitalizations,  surgeries, and ER visits in previous 12 months Vitals Screenings to include cognitive, depression, and falls Referrals and appointments  In addition, I have reviewed and discussed with patient certain preventive protocols, quality metrics, and best practice recommendations. A written personalized care plan for preventive services as well as general preventive health recommendations were provided to patient.   Lolita Libra, CMA   09/18/2023   After Visit Summary: (MyChart) Due to this being a telephonic visit, the after visit summary with patients personalized plan was offered to patient via MyChart   Notes: Nothing significant to report at this time.

## 2023-10-04 DIAGNOSIS — L821 Other seborrheic keratosis: Secondary | ICD-10-CM | POA: Diagnosis not present

## 2023-10-04 DIAGNOSIS — L814 Other melanin hyperpigmentation: Secondary | ICD-10-CM | POA: Diagnosis not present

## 2023-10-04 DIAGNOSIS — D225 Melanocytic nevi of trunk: Secondary | ICD-10-CM | POA: Diagnosis not present

## 2024-06-17 ENCOUNTER — Encounter: Admitting: Family Medicine

## 2024-09-19 ENCOUNTER — Ambulatory Visit
